# Patient Record
Sex: Male | Born: 1950 | Race: White | Hispanic: No | Marital: Married | Smoking: Never smoker
Health system: Southern US, Community
[De-identification: ages and names within clinical notes are randomized; demographics above are authoritative.]

## PROBLEM LIST (undated history)

## (undated) DIAGNOSIS — I4891 Unspecified atrial fibrillation: Secondary | ICD-10-CM

## (undated) HISTORY — PX: HERNIA REPAIR: SHX51

---

## 2005-07-29 ENCOUNTER — Ambulatory Visit: Payer: Self-pay | Admitting: Gastroenterology

## 2005-08-13 ENCOUNTER — Ambulatory Visit: Payer: Self-pay | Admitting: Gastroenterology

## 2006-08-18 ENCOUNTER — Ambulatory Visit: Payer: Self-pay | Admitting: Gastroenterology

## 2007-05-17 ENCOUNTER — Ambulatory Visit: Payer: Self-pay | Admitting: Specialist

## 2014-09-08 ENCOUNTER — Ambulatory Visit: Payer: Self-pay | Admitting: Endocrinology

## 2015-08-28 ENCOUNTER — Other Ambulatory Visit (HOSPITAL_BASED_OUTPATIENT_CLINIC_OR_DEPARTMENT_OTHER): Payer: Self-pay | Admitting: Orthopaedic Surgery

## 2015-08-29 ENCOUNTER — Encounter (HOSPITAL_BASED_OUTPATIENT_CLINIC_OR_DEPARTMENT_OTHER): Payer: Self-pay | Admitting: *Deleted

## 2015-09-03 ENCOUNTER — Other Ambulatory Visit (HOSPITAL_BASED_OUTPATIENT_CLINIC_OR_DEPARTMENT_OTHER): Payer: Self-pay | Admitting: Orthopaedic Surgery

## 2015-09-05 ENCOUNTER — Ambulatory Visit (HOSPITAL_BASED_OUTPATIENT_CLINIC_OR_DEPARTMENT_OTHER)
Admission: RE | Admit: 2015-09-05 | Discharge: 2015-09-05 | Disposition: A | Discharge status: Home / Self Care | Payer: Medicare Other | Source: Ambulatory Visit | Attending: Orthopaedic Surgery | Admitting: Orthopaedic Surgery

## 2015-09-05 ENCOUNTER — Encounter (HOSPITAL_BASED_OUTPATIENT_CLINIC_OR_DEPARTMENT_OTHER): Payer: Self-pay | Admitting: Certified Registered"

## 2015-09-05 ENCOUNTER — Ambulatory Visit (HOSPITAL_BASED_OUTPATIENT_CLINIC_OR_DEPARTMENT_OTHER): Payer: Medicare Other | Admitting: Certified Registered"

## 2015-09-05 ENCOUNTER — Encounter (HOSPITAL_BASED_OUTPATIENT_CLINIC_OR_DEPARTMENT_OTHER)
Admission: RE | Disposition: A | Discharge status: Home / Self Care | Payer: Self-pay | Source: Ambulatory Visit | Attending: Orthopaedic Surgery

## 2015-09-05 DIAGNOSIS — Z79899 Other long term (current) drug therapy: Secondary | ICD-10-CM | POA: Insufficient documentation

## 2015-09-05 DIAGNOSIS — M659 Synovitis and tenosynovitis, unspecified: Secondary | ICD-10-CM | POA: Diagnosis not present

## 2015-09-05 DIAGNOSIS — S83242A Other tear of medial meniscus, current injury, left knee, initial encounter: Secondary | ICD-10-CM | POA: Diagnosis present

## 2015-09-05 DIAGNOSIS — I4891 Unspecified atrial fibrillation: Secondary | ICD-10-CM | POA: Diagnosis not present

## 2015-09-05 DIAGNOSIS — Z7982 Long term (current) use of aspirin: Secondary | ICD-10-CM | POA: Insufficient documentation

## 2015-09-05 DIAGNOSIS — X58XXXA Exposure to other specified factors, initial encounter: Secondary | ICD-10-CM | POA: Diagnosis not present

## 2015-09-05 HISTORY — DX: Unspecified atrial fibrillation: I48.91

## 2015-09-05 HISTORY — PX: KNEE ARTHROSCOPY WITH MEDIAL MENISECTOMY: SHX5651

## 2015-09-05 SURGERY — ARTHROSCOPY, KNEE, WITH MEDIAL MENISCECTOMY
Anesthesia: General | Site: Knee | Laterality: Left

## 2015-09-05 MED ORDER — ATROPINE SULFATE 0.4 MG/ML IJ SOLN
INTRAMUSCULAR | Status: AC
  Filled 2015-09-05: qty 1

## 2015-09-05 MED ORDER — FENTANYL CITRATE (PF) 100 MCG/2ML IJ SOLN
INTRAMUSCULAR | Status: AC
  Filled 2015-09-05: qty 2

## 2015-09-05 MED ORDER — ONDANSETRON HCL 4 MG PO TABS: 4.0000 mg | ORAL_TABLET | Freq: Three times a day (TID) | ORAL | Status: DC | PRN

## 2015-09-05 MED ORDER — FENTANYL CITRATE (PF) 100 MCG/2ML IJ SOLN
50.0000 ug | INTRAMUSCULAR | Status: DC | PRN
  Administered 2015-09-05: 100 ug via INTRAVENOUS

## 2015-09-05 MED ORDER — CEFAZOLIN SODIUM-DEXTROSE 2-3 GM-% IV SOLR
2.0000 g | INTRAVENOUS | Status: AC
  Administered 2015-09-05: 2 g via INTRAVENOUS

## 2015-09-05 MED ORDER — CEFAZOLIN SODIUM-DEXTROSE 2-3 GM-% IV SOLR
INTRAVENOUS | Status: AC
  Filled 2015-09-05: qty 50

## 2015-09-05 MED ORDER — BUPIVACAINE HCL (PF) 0.25 % IJ SOLN
INTRAMUSCULAR | Status: AC
  Filled 2015-09-05: qty 30

## 2015-09-05 MED ORDER — MIDAZOLAM HCL 2 MG/2ML IJ SOLN
1.0000 mg | INTRAMUSCULAR | Status: DC | PRN
  Administered 2015-09-05: 2 mg via INTRAVENOUS

## 2015-09-05 MED ORDER — OXYCODONE HCL 5 MG PO TABS: 5.0000 mg | ORAL_TABLET | Freq: Once | ORAL | Status: DC | PRN

## 2015-09-05 MED ORDER — SCOPOLAMINE 1 MG/3DAYS TD PT72: 1.0000 | MEDICATED_PATCH | Freq: Once | TRANSDERMAL | Status: DC | PRN

## 2015-09-05 MED ORDER — DEXTROSE 5 % IV SOLN
10.0000 mg | INTRAVENOUS | Status: DC | PRN
  Administered 2015-09-05: 50 ug/min via INTRAVENOUS

## 2015-09-05 MED ORDER — PHENYLEPHRINE HCL 10 MG/ML IJ SOLN
INTRAMUSCULAR | Status: DC | PRN
  Administered 2015-09-05 (×4): 40 ug via INTRAVENOUS

## 2015-09-05 MED ORDER — PROPOFOL 500 MG/50ML IV EMUL
INTRAVENOUS | Status: AC
  Filled 2015-09-05: qty 50

## 2015-09-05 MED ORDER — SENNOSIDES-DOCUSATE SODIUM 8.6-50 MG PO TABS
1.0000 | ORAL_TABLET | Freq: Every evening | ORAL | Status: DC | PRN
Start: 2015-09-05 — End: 2020-04-01

## 2015-09-05 MED ORDER — ONDANSETRON HCL 4 MG/2ML IJ SOLN
INTRAMUSCULAR | Status: AC
  Filled 2015-09-05: qty 2

## 2015-09-05 MED ORDER — LIDOCAINE HCL (CARDIAC) 20 MG/ML IV SOLN
INTRAVENOUS | Status: AC
  Filled 2015-09-05: qty 5

## 2015-09-05 MED ORDER — BUPIVACAINE HCL (PF) 0.25 % IJ SOLN
INTRAMUSCULAR | Status: DC | PRN
  Administered 2015-09-05: 20 mL

## 2015-09-05 MED ORDER — OXYCODONE HCL 5 MG/5ML PO SOLN: 5.0000 mg | Freq: Once | ORAL | Status: DC | PRN

## 2015-09-05 MED ORDER — ONDANSETRON HCL 4 MG/2ML IJ SOLN
INTRAMUSCULAR | Status: DC | PRN
  Administered 2015-09-05: 4 mg via INTRAVENOUS

## 2015-09-05 MED ORDER — PROPOFOL 10 MG/ML IV BOLUS
INTRAVENOUS | Status: DC | PRN
Start: 2015-09-05 — End: 2015-09-05
  Administered 2015-09-05: 200 mg via INTRAVENOUS

## 2015-09-05 MED ORDER — PHENYLEPHRINE 40 MCG/ML (10ML) SYRINGE FOR IV PUSH (FOR BLOOD PRESSURE SUPPORT)
PREFILLED_SYRINGE | INTRAVENOUS | Status: AC
  Filled 2015-09-05: qty 10

## 2015-09-05 MED ORDER — DEXAMETHASONE SODIUM PHOSPHATE 4 MG/ML IJ SOLN
INTRAMUSCULAR | Status: DC | PRN
  Administered 2015-09-05: 10 mg via INTRAVENOUS

## 2015-09-05 MED ORDER — SUCCINYLCHOLINE CHLORIDE 20 MG/ML IJ SOLN
INTRAMUSCULAR | Status: AC
  Filled 2015-09-05: qty 1

## 2015-09-05 MED ORDER — MEPERIDINE HCL 25 MG/ML IJ SOLN: 6.2500 mg | INTRAMUSCULAR | Status: DC | PRN

## 2015-09-05 MED ORDER — DEXAMETHASONE SODIUM PHOSPHATE 10 MG/ML IJ SOLN
INTRAMUSCULAR | Status: AC
  Filled 2015-09-05: qty 1

## 2015-09-05 MED ORDER — GLYCOPYRROLATE 0.2 MG/ML IJ SOLN: 0.2000 mg | Freq: Once | INTRAMUSCULAR | Status: DC | PRN

## 2015-09-05 MED ORDER — SODIUM CHLORIDE 0.9 % IR SOLN
Status: DC | PRN
  Administered 2015-09-05: 3000 mL

## 2015-09-05 MED ORDER — HYDROCODONE-ACETAMINOPHEN 7.5-325 MG PO TABS: 1.0000 | ORAL_TABLET | Freq: Four times a day (QID) | ORAL | Status: DC | PRN

## 2015-09-05 MED ORDER — HYDROMORPHONE HCL 1 MG/ML IJ SOLN: 0.2500 mg | INTRAMUSCULAR | Status: DC | PRN

## 2015-09-05 MED ORDER — LACTATED RINGERS IV SOLN
INTRAVENOUS | Status: DC
  Administered 2015-09-05 (×2): via INTRAVENOUS

## 2015-09-05 MED ORDER — MIDAZOLAM HCL 2 MG/2ML IJ SOLN
INTRAMUSCULAR | Status: AC
  Filled 2015-09-05: qty 2

## 2015-09-05 MED ORDER — PHENYLEPHRINE HCL 10 MG/ML IJ SOLN
INTRAMUSCULAR | Status: AC
  Filled 2015-09-05: qty 1

## 2015-09-05 MED ORDER — LIDOCAINE HCL (CARDIAC) 20 MG/ML IV SOLN
INTRAVENOUS | Status: DC | PRN
  Administered 2015-09-05: 80 mg via INTRAVENOUS

## 2015-09-05 SURGICAL SUPPLY — 40 items
BANDAGE ACE 6X5 VEL STRL LF (GAUZE/BANDAGES/DRESSINGS) ×6 IMPLANT
BANDAGE ESMARK 6X9 LF (GAUZE/BANDAGES/DRESSINGS) IMPLANT
BLADE 4.2CUDA (BLADE) ×3 IMPLANT
BLADE CUDA GRT WHITE 3.5 (BLADE) ×3 IMPLANT
BLADE CUDA SHAVER 3.5 (BLADE) IMPLANT
BLADE CUTTER GATOR 3.5 (BLADE) IMPLANT
BNDG ESMARK 6X9 LF (GAUZE/BANDAGES/DRESSINGS)
CUFF TOURNIQUET SINGLE 34IN LL (TOURNIQUET CUFF) ×3 IMPLANT
DRAPE ARTHROSCOPY W/POUCH 90 (DRAPES) ×3 IMPLANT
DRAPE SURG 17X23 STRL (DRAPES) ×6 IMPLANT
DRAPE U 20/CS (DRAPES) IMPLANT
DURAPREP 26ML APPLICATOR (WOUND CARE) ×3 IMPLANT
ELECT MENISCUS 165MM 90D (ELECTRODE) IMPLANT
ELECT REM PT RETURN 9FT ADLT (ELECTROSURGICAL)
ELECTRODE REM PT RTRN 9FT ADLT (ELECTROSURGICAL) IMPLANT
GAUZE SPONGE 4X4 12PLY STRL (GAUZE/BANDAGES/DRESSINGS) ×3 IMPLANT
GAUZE XEROFORM 1X8 LF (GAUZE/BANDAGES/DRESSINGS) ×3 IMPLANT
GLOVE BIO SURGEON STRL SZ7 (GLOVE) ×3 IMPLANT
GLOVE BIOGEL PI IND STRL 7.0 (GLOVE) ×2 IMPLANT
GLOVE BIOGEL PI INDICATOR 7.0 (GLOVE) ×4
GLOVE ECLIPSE 6.5 STRL STRAW (GLOVE) ×3 IMPLANT
GLOVE SKINSENSE NS SZ7.5 (GLOVE) ×2
GLOVE SKINSENSE STRL SZ7.5 (GLOVE) ×1 IMPLANT
GLOVE SURG SYN 7.5  E (GLOVE) ×2
GLOVE SURG SYN 7.5 E (GLOVE) ×1 IMPLANT
GOWN STRL REIN XL XLG (GOWN DISPOSABLE) ×3 IMPLANT
GOWN STRL REUS W/ TWL LRG LVL3 (GOWN DISPOSABLE) ×1 IMPLANT
GOWN STRL REUS W/TWL LRG LVL3 (GOWN DISPOSABLE) ×2
KNEE WRAP E Z 3 GEL PACK (MISCELLANEOUS) ×3 IMPLANT
MANIFOLD NEPTUNE II (INSTRUMENTS) ×3 IMPLANT
PACK ARTHROSCOPY DSU (CUSTOM PROCEDURE TRAY) ×3 IMPLANT
PACK BASIN DAY SURGERY FS (CUSTOM PROCEDURE TRAY) ×3 IMPLANT
PENCIL BUTTON HOLSTER BLD 10FT (ELECTRODE) IMPLANT
RESECTOR FULL RADIUS 4.2MM (BLADE) IMPLANT
SET ARTHROSCOPY TUBING (MISCELLANEOUS) ×2
SET ARTHROSCOPY TUBING LN (MISCELLANEOUS) ×1 IMPLANT
SUT ETHILON 3 0 PS 1 (SUTURE) ×3 IMPLANT
TOWEL OR 17X24 6PK STRL BLUE (TOWEL DISPOSABLE) ×3 IMPLANT
TOWEL OR NON WOVEN STRL DISP B (DISPOSABLE) IMPLANT
WATER STERILE IRR 1000ML POUR (IV SOLUTION) ×3 IMPLANT

## 2015-09-05 NOTE — Op Note (Signed)
   Surgery Date: 09/05/2015  Surgeon(s): Arius Harnois Donnelly Stager, MD  ANESTHESIA:  general  FLUIDS: Per anesthesia record.   ESTIMATED BLOOD LOSS: minimal  PREOPERATIVE DIAGNOSES:  1. Left knee medial meniscus tear 2.  knee synovitis  POSTOPERATIVE DIAGNOSES:  same  PROCEDURES PERFORMED:  1. Left knee arthroscopy with extensive synovectomy 2. knee arthroscopy with arthroscopic partial medial meniscectomy 3. knee arthroscopy with arthroscopic chondroplasty medial femoral condyle and medial tibial plateau.   DESCRIPTION OF PROCEDURE: Mr. Sortor is a 65 y.o.-year-old male with left knee medial meniscus tear. Plans are to proceed with partial medial meniscectomy and diagnostic arthroscopy with debridement as indicated. Full discussion held regarding risks benefits alternatives and complications related surgical intervention. Conservative care options reviewed. All questions answered.  The patient was identified in the preoperative holding area and the operative extremity was marked. The patient was brought to the operating room and transferred to operating table in a supine position. Satisfactory general anesthesia was induced by anesthesiology.    Standard anterolateral, anteromedial arthroscopy portals were obtained. The anteromedial portal was obtained with a spinal needle for localization under direct visualization with subsequent diagnostic findings.   Anteromedial and anterolateral chambers: mild synovitis. The synovitis was debrided with a 4.5 mm full radius shaver through both the anteromedial and lateral portals.   Suprapatellar pouch and gutters: moderate synovitis or debris. Patella chondral surface: Grade 3 Trochlear chondral surface: Grade 3 Patellofemoral tracking: normal Medial meniscus: degenerative horizontal cleavage tear of posterior horn.  Medial femoral condyle flexion bearing surface: Grade 2 Medial femoral condyle extension bearing surface: Grade 2 Medial tibial plateau:  Grade 2 Anterior cruciate ligament:stable Posterior cruciate ligament:stable Lateral meniscus: normal.   Lateral femoral condyle flexion bearing surface: Grade 0 Lateral femoral condyle extension bearing surface: Grade 0 Lateral tibial plateau: Grade 0  After completion of synovectomy, diagnostic exam, and debridements as described, all compartments were checked and no residual debris remained. Hemostasis was achieved with the cautery wand. The portals were approximated with interrupted nylon suture. All excess fluid was expressed from the joint. The portals were approximated with interrupted nylon suture. Xeroform sterile gauze dressings were applied followed by Ace bandage and ice pack.   DISPOSITION: The patient was awakened from general anesthetic, extubated, taken to the recovery room in medically stable condition, no apparent complications. The patient may be weightbearing as tolerated to the operative lower extremity.  Range of motion of right knee as tolerated.  Mayra Reel, MD [facility] [phone] 9:26 AM

## 2015-09-05 NOTE — Anesthesia Procedure Notes (Signed)
Procedure Name: LMA Insertion Date/Time: 09/05/2015 8:41 AM Performed by: Curly [place] Pre-anesthesia Checklist: Patient identified, Emergency Drugs available, Suction available and Patient being monitored Patient Re-evaluated:Patient Re-evaluated prior to inductionOxygen Delivery Method: Circle System Utilized Preoxygenation: Pre-oxygenation with 100% oxygen Intubation Type: IV induction Ventilation: Mask ventilation without difficulty LMA: LMA inserted LMA Size: 5.0 Number of attempts: 1 Airway Equipment and Method: Bite block Placement Confirmation: positive ETCO2 and breath sounds checked- equal and bilateral Tube secured with: Tape Dental Injury: Teeth and Oropharynx as per pre-operative assessment

## 2015-09-05 NOTE — Anesthesia Preprocedure Evaluation (Signed)
Anesthesia Evaluation  Patient identified by MRN, date of birth, ID band Patient awake    Reviewed: Allergy & Precautions, NPO status , Patient's Chart, lab work & pertinent test results, reviewed documented beta blocker date and time   Airway Mallampati: II  TM Distance: >3 FB Neck ROM: Full    Dental  (+) Teeth Intact, Dental Advisory Given   Pulmonary    breath sounds clear to auscultation       Cardiovascular  Rhythm:Regular Rate:Normal     Neuro/Psych    GI/Hepatic   Endo/Other    Renal/GU      Musculoskeletal   Abdominal   Peds  Hematology   Anesthesia Other Findings Hx of A Fib that doesn't affect his activities.  Reproductive/Obstetrics                             Anesthesia Physical Anesthesia Plan  ASA: II  Anesthesia Plan: General   Post-op Pain Management:    Induction: Intravenous  Airway Management Planned: LMA  Additional Equipment:   Intra-op Plan:   Post-operative Plan: Extubation in OR  Informed Consent: I have reviewed the patients History and Physical, chart, labs and discussed the procedure including the risks, benefits and alternatives for the proposed anesthesia with the patient or authorized representative who has indicated his/her understanding and acceptance.   Dental advisory given  Plan Discussed with: CRNA, Anesthesiologist and Surgeon  Anesthesia Plan Comments:         Anesthesia Quick Evaluation

## 2015-09-05 NOTE — Transfer of Care (Signed)
Immediate Anesthesia Transfer of Care Note  Patient: Johnny Shepard  Procedure(s) Performed: Procedure(s): LEFT KNEE ARTHROSCOPY WITH PARTIAL MEDIAL MENISCECTOMY and synovectomy (Left)  Patient Location: PACU  Anesthesia Type:General  Level of Consciousness: awake, alert  and patient cooperative  Airway & Oxygen Therapy: Patient Spontanous Breathing and Patient connected to face mask oxygen  Post-op Assessment: Report given to RN, Post -op Vital signs reviewed and stable and Patient moving all extremities  Post vital signs: Reviewed and stable  Last Vitals:  Filed Vitals:   09/05/15 0732  BP: 144/108  Pulse: 54  Temp: 36.4 C  Resp: 20    Complications: No apparent anesthesia complications

## 2015-09-05 NOTE — H&P (Signed)
    PREOPERATIVE H&P  Chief Complaint: Left knee medial meniscal tear  HPI: Johnny Shepard is a 65 y.o. male who presents for surgical treatment of Left knee medial meniscal tear.  He denies any changes in medical history.  Past Medical History  Diagnosis Date  . A-fib [facility])    Past Surgical History  Procedure Laterality Date  . Hernia repair     Social History   Social History  . Marital Status: Married    Spouse Name: N/A  . Number of Children: N/A  . Years of Education: N/A   Social History Main Topics  . Smoking status: Never Smoker   . Smokeless tobacco: Never Used  . Alcohol Use: No  . Drug Use: No  . Sexual Activity: Not Asked   Other Topics Concern  . None   Social History Narrative   History reviewed. No pertinent family history. Allergies  Allergen Reactions  . Bee Venom Shortness Of Breath  . Iodine Shortness Of Breath   Prior to Admission medications   Medication Sig Start Date End Date Taking? Authorizing Provider  aspirin EC 325 MG tablet Take 325 mg by mouth daily.   Yes Historical Provider, MD  Multiple Vitamins-Minerals (MENS 50+ ADVANCED) CAPS Take by mouth.   Yes Historical Provider, MD  sotalol (BETAPACE) 80 MG tablet Take 80 mg by mouth 2 (two) times daily.   Yes Historical Provider, MD     Positive ROS: All other systems have been reviewed and were otherwise negative with the exception of those mentioned in the HPI and as above.  Physical Exam: General: Alert, no acute distress Cardiovascular: No pedal edema Respiratory: No cyanosis, no use of accessory musculature GI: abdomen soft Skin: No lesions in the area of chief complaint Neurologic: Sensation intact distally Psychiatric: Patient is competent for consent with normal mood and affect Lymphatic: no lymphedema  MUSCULOSKELETAL: exam stable  Assessment: Left knee medial meniscal tear  Plan: Plan for Procedure(s): LEFT KNEE ARTHROSCOPY WITH PARTIAL MEDIAL MENISCECTOMY  The  risks benefits and alternatives were discussed with the patient including but not limited to the risks of nonoperative treatment, versus surgical intervention including infection, bleeding, nerve injury,  blood clots, cardiopulmonary complications, morbidity, mortality, among others, and they were willing to proceed.   Cheral Almas, MD   09/05/2015 8:28 AM

## 2015-09-05 NOTE — Discharge Instructions (Signed)
Post-operative patient instructions  °Knee Arthroscopy  ° °• Ice:  Place intermittent ice or cooler pack over your knee, 30 minutes on and 30 minutes off.  Continue this for the first 72 hours after surgery, then save ice for use after therapy sessions or on more active days.   °• Weight:  You may bear weight on your leg as your symptoms allow. °• Crutches:  Use crutches (or walker) to assist in walking until told to discontinue by your physical therapist or physician. This will help to reduce pain. °• Strengthening:  Perform simple thigh squeezes (isometric quad contractions) and straight leg lifts as you are able (3 sets of 5 to 10 repetitions, 3 times a day).  For the leg lifts, have someone support under your ankle in the beginning until you have increased strength enough to do this on your own.  To help get started on thigh squeezes, place a pillow under your knee and push down on the pillow with back of knee (sometimes easier to do than with your leg fully straight). °• Motion:  Perform gentle knee motion as tolerated - this is gentle bending and straightening of the knee. Seated heel slides: you can start by sitting in a chair, remove your brace, and gently slide your heel back on the floor - allowing your knee to bend. Have someone help you straighten your knee (or use your other leg/foot hooked under your ankle.  °• Dressing:  Perform 1st dressing change at 2 days postoperative. A moderate amount of blood tinged drainage is to be expected.  So if you bleed through the dressing on the first or second day or if you have fevers, it is fine to change the dressing/check the wounds early and redress wound. Elevate your leg.  If it bleeds through again, or if the incisions are leaking frank blood, please call the office. May change dressing every 1-2 days thereafter to help watch wounds. Can purchase Tegderm (or 3M Nexcare) water resistant dressings at local pharmacy / [employer]. °• Shower:  Light shower is ok after  2 days.  Please take shower, NO bath. Recover with gauze and ace wrap to help keep wounds protected.   °• Pain medication:  A narcotic pain medication has been prescribed.  Take as directed.  Typically you need narcotic pain medication more regularly during the first 3 to 5 days after surgery.  Decrease your use of the medication as the pain improves.  Narcotics can sometimes cause constipation, even after a few doses.  If you have problems with constipation, you can take an over the counter stool softener or light laxative.  If you have persistent problems, please notify your physician’s office. °• Physical therapy: Additional activity guidelines to be provided by your physician or physical therapist at follow-up visits.  °• Driving: Do not recommend driving x 2 weeks post surgical, especially if surgery performed on right side. Should not drive while taking narcotic pain medications. It typically takes at least 2 weeks to restore sufficient neuromuscular function for normal reaction times for driving safety.  °• Call [phone] for questions or problems. Evenings you will be forwarded to the hospital operator.  Ask for the orthopaedic physician on call. Please call if you experience:  °  °o Redness, foul smelling, or persistent drainage from the surgical site  °o worsening knee pain and swelling not responsive to medication  °o any calf pain and or swelling of the lower leg  °o temperatures greater than 100.5 F °  o other questions or concerns ° ° °Thank you for allowing us to be a part of your care. ° °Post Anesthesia Home Care Instructions ° °Activity: °Get plenty of rest for the remainder of the day. A responsible adult should stay with you for 24 hours following the procedure.  °For the next 24 hours, DO NOT: °-Drive a car °-Operate machinery °-Drink alcoholic beverages °-Take any medication unless instructed by your physician °-Make any legal decisions or sign important papers. ° °Meals: °Start with liquid  foods such as gelatin or soup. Progress to regular foods as tolerated. Avoid greasy, spicy, heavy foods. If nausea and/or vomiting occur, drink only clear liquids until the nausea and/or vomiting subsides. Call your physician if vomiting continues. ° °Special Instructions/Symptoms: °Your throat may feel dry or sore from the anesthesia or the breathing tube placed in your throat during surgery. If this causes discomfort, gargle with warm salt water. The discomfort should disappear within 24 hours. ° °If you had a scopolamine patch placed behind your ear for the management of post- operative nausea and/or vomiting: ° °1. The medication in the patch is effective for 72 hours, after which it should be removed.  Wrap patch in a tissue and discard in the trash. Wash hands thoroughly with soap and water. °2. You may remove the patch earlier than 72 hours if you experience unpleasant side effects which may include dry mouth, dizziness or visual disturbances. °3. Avoid touching the patch. Wash your hands with soap and water after contact with the patch. °  ° °

## 2015-09-05 NOTE — Anesthesia Postprocedure Evaluation (Signed)
Anesthesia Post Note  Patient: Johnny Shepard  Procedure(s) Performed: Procedure(s) (LRB): LEFT KNEE ARTHROSCOPY WITH PARTIAL MEDIAL MENISCECTOMY and synovectomy (Left)  Patient location during evaluation: PACU Anesthesia Type: General Level of consciousness: awake and alert Pain management: pain level controlled Vital Signs Assessment: post-procedure vital signs reviewed and stable Respiratory status: spontaneous breathing, nonlabored ventilation and respiratory function stable Cardiovascular status: blood pressure returned to baseline and stable Postop Assessment: no signs of nausea or vomiting Anesthetic complications: no    Last Vitals:  Filed Vitals:   09/05/15 1000 09/05/15 1015  BP: 118/81 121/86  Pulse: 69 59  Temp:    Resp: 14 19    Last Pain:  Filed Vitals:   09/05/15 1023  PainSc: 0-No pain                 Dymon Summerhill A

## 2015-09-06 ENCOUNTER — Encounter (HOSPITAL_BASED_OUTPATIENT_CLINIC_OR_DEPARTMENT_OTHER): Payer: Self-pay | Admitting: Orthopaedic Surgery

## 2020-04-01 ENCOUNTER — Other Ambulatory Visit: Payer: Self-pay

## 2020-04-01 ENCOUNTER — Emergency Department: Payer: Medicare Other

## 2020-04-01 ENCOUNTER — Inpatient Hospital Stay
Admission: EM | Admit: 2020-04-01 | Discharge: 2020-05-11 | DRG: 208 | Disposition: E | Payer: Medicare Other | Attending: Internal Medicine | Admitting: Internal Medicine

## 2020-04-01 DIAGNOSIS — E785 Hyperlipidemia, unspecified: Secondary | ICD-10-CM | POA: Diagnosis present

## 2020-04-01 DIAGNOSIS — R0902 Hypoxemia: Secondary | ICD-10-CM

## 2020-04-01 DIAGNOSIS — J9601 Acute respiratory failure with hypoxia: Secondary | ICD-10-CM | POA: Diagnosis present

## 2020-04-01 DIAGNOSIS — Z7982 Long term (current) use of aspirin: Secondary | ICD-10-CM | POA: Diagnosis not present

## 2020-04-01 DIAGNOSIS — Z283 Underimmunization status: Secondary | ICD-10-CM | POA: Diagnosis not present

## 2020-04-01 DIAGNOSIS — E878 Other disorders of electrolyte and fluid balance, not elsewhere classified: Secondary | ICD-10-CM | POA: Diagnosis present

## 2020-04-01 DIAGNOSIS — E871 Hypo-osmolality and hyponatremia: Secondary | ICD-10-CM | POA: Diagnosis present

## 2020-04-01 DIAGNOSIS — R531 Weakness: Secondary | ICD-10-CM | POA: Diagnosis not present

## 2020-04-01 DIAGNOSIS — N179 Acute kidney failure, unspecified: Secondary | ICD-10-CM | POA: Diagnosis not present

## 2020-04-01 DIAGNOSIS — Z789 Other specified health status: Secondary | ICD-10-CM

## 2020-04-01 DIAGNOSIS — Z66 Do not resuscitate: Secondary | ICD-10-CM | POA: Diagnosis not present

## 2020-04-01 DIAGNOSIS — R739 Hyperglycemia, unspecified: Secondary | ICD-10-CM | POA: Diagnosis not present

## 2020-04-01 DIAGNOSIS — D6869 Other thrombophilia: Secondary | ICD-10-CM | POA: Diagnosis not present

## 2020-04-01 DIAGNOSIS — I959 Hypotension, unspecified: Secondary | ICD-10-CM | POA: Diagnosis not present

## 2020-04-01 DIAGNOSIS — D6859 Other primary thrombophilia: Secondary | ICD-10-CM | POA: Diagnosis not present

## 2020-04-01 DIAGNOSIS — I4891 Unspecified atrial fibrillation: Secondary | ICD-10-CM | POA: Diagnosis not present

## 2020-04-01 DIAGNOSIS — Z978 Presence of other specified devices: Secondary | ICD-10-CM

## 2020-04-01 DIAGNOSIS — J80 Acute respiratory distress syndrome: Secondary | ICD-10-CM | POA: Diagnosis not present

## 2020-04-01 DIAGNOSIS — I482 Chronic atrial fibrillation, unspecified: Secondary | ICD-10-CM | POA: Diagnosis present

## 2020-04-01 DIAGNOSIS — J1282 Pneumonia due to coronavirus disease 2019: Secondary | ICD-10-CM | POA: Diagnosis present

## 2020-04-01 DIAGNOSIS — D696 Thrombocytopenia, unspecified: Secondary | ICD-10-CM | POA: Diagnosis not present

## 2020-04-01 DIAGNOSIS — G931 Anoxic brain damage, not elsewhere classified: Secondary | ICD-10-CM | POA: Diagnosis not present

## 2020-04-01 DIAGNOSIS — Z4682 Encounter for fitting and adjustment of non-vascular catheter: Secondary | ICD-10-CM

## 2020-04-01 DIAGNOSIS — U071 COVID-19: Secondary | ICD-10-CM | POA: Diagnosis present

## 2020-04-01 DIAGNOSIS — Z7901 Long term (current) use of anticoagulants: Secondary | ICD-10-CM | POA: Diagnosis not present

## 2020-04-01 DIAGNOSIS — R7401 Elevation of levels of liver transaminase levels: Secondary | ICD-10-CM | POA: Diagnosis present

## 2020-04-01 DIAGNOSIS — J982 Interstitial emphysema: Secondary | ICD-10-CM

## 2020-04-01 DIAGNOSIS — R0602 Shortness of breath: Secondary | ICD-10-CM | POA: Diagnosis present

## 2020-04-01 DIAGNOSIS — Z95818 Presence of other cardiac implants and grafts: Secondary | ICD-10-CM

## 2020-04-01 DIAGNOSIS — Z888 Allergy status to other drugs, medicaments and biological substances status: Secondary | ICD-10-CM

## 2020-04-01 DIAGNOSIS — Z4659 Encounter for fitting and adjustment of other gastrointestinal appliance and device: Secondary | ICD-10-CM

## 2020-04-01 DIAGNOSIS — M79604 Pain in right leg: Secondary | ICD-10-CM

## 2020-04-01 DIAGNOSIS — M79662 Pain in left lower leg: Secondary | ICD-10-CM

## 2020-04-01 DIAGNOSIS — J9600 Acute respiratory failure, unspecified whether with hypoxia or hypercapnia: Secondary | ICD-10-CM

## 2020-04-01 DIAGNOSIS — Z91030 Bee allergy status: Secondary | ICD-10-CM

## 2020-04-01 DIAGNOSIS — J9311 Primary spontaneous pneumothorax: Secondary | ICD-10-CM | POA: Diagnosis not present

## 2020-04-01 DIAGNOSIS — J9383 Other pneumothorax: Secondary | ICD-10-CM | POA: Diagnosis not present

## 2020-04-01 DIAGNOSIS — J939 Pneumothorax, unspecified: Secondary | ICD-10-CM

## 2020-04-01 DIAGNOSIS — Z79899 Other long term (current) drug therapy: Secondary | ICD-10-CM

## 2020-04-01 DIAGNOSIS — I82409 Acute embolism and thrombosis of unspecified deep veins of unspecified lower extremity: Secondary | ICD-10-CM

## 2020-04-01 LAB — LACTATE DEHYDROGENASE: LDH: 390 U/L — ABNORMAL HIGH (ref 98–192)

## 2020-04-01 LAB — FIBRIN DERIVATIVES D-DIMER (ARMC ONLY)
Fibrin derivatives D-dimer (ARMC): 935.68 ng/mL (FEU) — ABNORMAL HIGH (ref 0.00–499.00)
Fibrin derivatives D-dimer (ARMC): 914.35 ng/mL (FEU) — ABNORMAL HIGH (ref 0.00–499.00)

## 2020-04-01 LAB — FERRITIN: Ferritin: 1605 ng/mL — ABNORMAL HIGH (ref 24–336)

## 2020-04-01 LAB — CBC WITH DIFFERENTIAL/PLATELET
Abs Immature Granulocytes: 0.04 10*3/uL (ref 0.00–0.07)
Basophils Absolute: 0 10*3/uL (ref 0.0–0.1)
Basophils Relative: 0 %
Eosinophils Absolute: 0 10*3/uL (ref 0.0–0.5)
Eosinophils Relative: 0 %
HCT: 41.5 % (ref 39.0–52.0)
Hemoglobin: 14.1 g/dL (ref 13.0–17.0)
Immature Granulocytes: 1 %
Lymphocytes Relative: 12 %
Lymphs Abs: 0.7 10*3/uL (ref 0.7–4.0)
MCH: 29.7 pg (ref 26.0–34.0)
MCHC: 34 g/dL (ref 30.0–36.0)
MCV: 87.6 fL (ref 80.0–100.0)
Monocytes Absolute: 0.4 10*3/uL (ref 0.1–1.0)
Monocytes Relative: 6 %
Neutro Abs: 5 10*3/uL (ref 1.7–7.7)
Neutrophils Relative %: 81 %
Platelets: 161 10*3/uL (ref 150–400)
RBC: 4.74 MIL/uL (ref 4.22–5.81)
RDW: 13.6 % (ref 11.5–15.5)
Smear Review: NORMAL
WBC: 6 10*3/uL (ref 4.0–10.5)
nRBC: 0 % (ref 0.0–0.2)

## 2020-04-01 LAB — COMPREHENSIVE METABOLIC PANEL
ALT: 33 U/L (ref 0–44)
AST: 56 U/L — ABNORMAL HIGH (ref 15–41)
Albumin: 2.8 g/dL — ABNORMAL LOW (ref 3.5–5.0)
Alkaline Phosphatase: 30 U/L — ABNORMAL LOW (ref 38–126)
Anion gap: 13 (ref 5–15)
BUN: 28 mg/dL — ABNORMAL HIGH (ref 8–23)
CO2: 23 mmol/L (ref 22–32)
Calcium: 8.1 mg/dL — ABNORMAL LOW (ref 8.9–10.3)
Chloride: 97 mmol/L — ABNORMAL LOW (ref 98–111)
Creatinine, Ser: 0.88 mg/dL (ref 0.61–1.24)
GFR calc Af Amer: >60 mL/min (ref >60)
GFR calc non Af Amer: >60 mL/min (ref >60)
Glucose, Bld: 131 mg/dL — ABNORMAL HIGH (ref 70–99)
Potassium: 3.9 mmol/L (ref 3.5–5.1)
Sodium: 133 mmol/L — ABNORMAL LOW (ref 135–145)
Total Bilirubin: 0.7 mg/dL (ref 0.3–1.2)
Total Protein: 6.7 g/dL (ref 6.5–8.1)

## 2020-04-01 LAB — PROCALCITONIN
Procalcitonin: <0.1 ng/mL
Procalcitonin: <0.1 ng/mL

## 2020-04-01 LAB — BRAIN NATRIURETIC PEPTIDE: B Natriuretic Peptide: 92 pg/mL (ref 0.0–100.0)

## 2020-04-01 LAB — SARS CORONAVIRUS 2 BY RT PCR (HOSPITAL ORDER, PERFORMED IN ~~LOC~~ HOSPITAL LAB): SARS Coronavirus 2: POSITIVE — AB

## 2020-04-01 LAB — TROPONIN I (HIGH SENSITIVITY)
Troponin I (High Sensitivity): 8 ng/L (ref <18)
Troponin I (High Sensitivity): 10 ng/L (ref <18)

## 2020-04-01 LAB — C-REACTIVE PROTEIN: CRP: 5.9 mg/dL — ABNORMAL HIGH (ref <1.0)

## 2020-04-01 MED ORDER — HYDROCOD POLST-CPM POLST ER 10-8 MG/5ML PO SUER
5.0000 mL | Freq: Two times a day (BID) | ORAL | Status: DC | PRN
  Administered 2020-04-11: 5 mL via ORAL
  Filled 2020-04-01: qty 5

## 2020-04-01 MED ORDER — ZINC SULFATE 220 (50 ZN) MG PO CAPS
220.0000 mg | ORAL_CAPSULE | Freq: Every day | ORAL | Status: DC
  Administered 2020-04-02 – 2020-04-13 (×11): 220 mg via ORAL
  Filled 2020-04-01 (×11): qty 1

## 2020-04-01 MED ORDER — ACETAMINOPHEN 325 MG PO TABS
650.0000 mg | ORAL_TABLET | Freq: Four times a day (QID) | ORAL | Status: DC | PRN
  Administered 2020-04-04 – 2020-04-13 (×7): 650 mg via ORAL
  Filled 2020-04-01 (×8): qty 2

## 2020-04-01 MED ORDER — DEXAMETHASONE SODIUM PHOSPHATE 10 MG/ML IJ SOLN
INTRAMUSCULAR | Status: AC
  Administered 2020-04-01: 10 mg via INTRAVENOUS
  Filled 2020-04-01: qty 1

## 2020-04-01 MED ORDER — ONDANSETRON HCL 4 MG/2ML IJ SOLN: 4.0000 mg | Freq: Four times a day (QID) | INTRAMUSCULAR | Status: DC | PRN

## 2020-04-01 MED ORDER — FAMOTIDINE 20 MG PO TABS
20.0000 mg | ORAL_TABLET | Freq: Two times a day (BID) | ORAL | Status: DC
  Administered 2020-04-01 – 2020-04-13 (×24): 20 mg via ORAL
  Filled 2020-04-01 (×24): qty 1

## 2020-04-01 MED ORDER — ENOXAPARIN SODIUM 40 MG/0.4ML ~~LOC~~ SOLN
40.0000 mg | SUBCUTANEOUS | Status: DC
  Filled 2020-04-01: qty 0.4

## 2020-04-01 MED ORDER — DEXAMETHASONE SODIUM PHOSPHATE 10 MG/ML IJ SOLN: 10.0000 mg | Freq: Once | INTRAMUSCULAR | Status: AC

## 2020-04-01 MED ORDER — VITAMIN D 25 MCG (1000 UNIT) PO TABS
1000.0000 [IU] | ORAL_TABLET | Freq: Every day | ORAL | Status: DC
  Administered 2020-04-02 – 2020-04-13 (×12): 1000 [IU] via ORAL
  Filled 2020-04-01 (×12): qty 1

## 2020-04-01 MED ORDER — SODIUM CHLORIDE 0.9 % IV SOLN
200.0000 mg | Freq: Once | INTRAVENOUS | Status: AC
  Administered 2020-04-01: 200 mg via INTRAVENOUS
  Filled 2020-04-01: qty 200

## 2020-04-01 MED ORDER — SODIUM CHLORIDE 0.9 % IV SOLN
100.0000 mg | Freq: Every day | INTRAVENOUS | Status: AC
  Administered 2020-04-02 – 2020-04-05 (×4): 100 mg via INTRAVENOUS
  Filled 2020-04-01: qty 100
  Filled 2020-04-01 (×2): qty 20
  Filled 2020-04-01 (×3): qty 100
  Filled 2020-04-01: qty 20

## 2020-04-01 MED ORDER — ASPIRIN EC 325 MG PO TBEC
325.0000 mg | DELAYED_RELEASE_TABLET | Freq: Every day | ORAL | Status: DC
  Administered 2020-04-02 – 2020-04-13 (×12): 325 mg via ORAL
  Filled 2020-04-01 (×13): qty 1

## 2020-04-01 MED ORDER — GUAIFENESIN-DM 100-10 MG/5ML PO SYRP
10.0000 mL | ORAL_SOLUTION | ORAL | Status: DC | PRN
  Administered 2020-04-03 – 2020-04-06 (×3): 10 mL via ORAL
  Filled 2020-04-01 (×4): qty 10

## 2020-04-01 MED ORDER — TRAZODONE HCL 50 MG PO TABS
25.0000 mg | ORAL_TABLET | Freq: Every evening | ORAL | Status: DC | PRN
  Administered 2020-04-09 – 2020-04-11 (×2): 25 mg via ORAL
  Filled 2020-04-01 (×2): qty 1

## 2020-04-01 MED ORDER — DEXAMETHASONE SODIUM PHOSPHATE 10 MG/ML IJ SOLN
6.0000 mg | INTRAMUSCULAR | Status: DC
  Filled 2020-04-01: qty 0.6

## 2020-04-01 MED ORDER — ASCORBIC ACID 500 MG PO TABS
500.0000 mg | ORAL_TABLET | Freq: Every day | ORAL | Status: DC
  Administered 2020-04-02 – 2020-04-13 (×12): 500 mg via ORAL
  Filled 2020-04-01 (×12): qty 1

## 2020-04-01 MED ORDER — MAGNESIUM HYDROXIDE 400 MG/5ML PO SUSP: 30.0000 mL | Freq: Every day | ORAL | Status: DC | PRN

## 2020-04-01 MED ORDER — TOCILIZUMAB 400 MG/20ML IV SOLN: 8.0000 mg/kg | Freq: Once | INTRAVENOUS | Status: DC

## 2020-04-01 MED ORDER — ONDANSETRON HCL 4 MG PO TABS: 4.0000 mg | ORAL_TABLET | Freq: Four times a day (QID) | ORAL | Status: DC | PRN

## 2020-04-01 MED ORDER — GUAIFENESIN ER 600 MG PO TB12
600.0000 mg | ORAL_TABLET | Freq: Two times a day (BID) | ORAL | Status: DC
  Administered 2020-04-01 – 2020-04-13 (×24): 600 mg via ORAL
  Filled 2020-04-01 (×24): qty 1

## 2020-04-01 MED ORDER — BARICITINIB 2 MG PO TABS
4.0000 mg | ORAL_TABLET | Freq: Every day | ORAL | Status: DC
  Filled 2020-04-01: qty 2

## 2020-04-01 MED ORDER — LACTATED RINGERS IV BOLUS
1000.0000 mL | Freq: Once | INTRAVENOUS | Status: AC
  Administered 2020-04-01: 1000 mL via INTRAVENOUS

## 2020-04-01 MED ORDER — SOTALOL HCL 80 MG PO TABS
80.0000 mg | ORAL_TABLET | Freq: Two times a day (BID) | ORAL | Status: DC
  Filled 2020-04-01 (×2): qty 1

## 2020-04-01 MED ORDER — SODIUM CHLORIDE 0.9 % IV SOLN: INTRAVENOUS | Status: DC

## 2020-04-01 NOTE — H&P (Signed)
[place]   PATIENT NAME: Johnny Shepard    MR#:  [id number]  DATE OF BIRTH:  01-20-1951  DATE OF ADMISSION:  2020/04/12  PRIMARY CARE PHYSICIAN: No primary care provider on file.   REQUESTING/REFERRING PHYSICIAN: Chesley Noon, MD  CHIEF COMPLAINT:   Chief Complaint  Patient presents with  . Shortness of Breath    HISTORY OF PRESENT ILLNESS:  Johnny Shepard  is a 69 y.o. Caucasian male with a known history of atrial fibrillation, who presented to the emergency room with acute onset of worsening dyspnea with associated fatigue and tiredness.  He tested positive for COVID-19 4 days ago and prior to that he has been feeling bad since Saturday 8 days ago.  He was not vaccinated for COVID-19.  He admitted to intermittent fever and chills with a T-max of 101.  No loss of taste or smell.  No nausea vomiting or diarrhea.  He admitted to mild dry cough with occasional wheezing.  He noted his pulse oximetry was low today and his dyspnea started today and got significantly worse before he came to the ER.  No dysuria, oliguria or hematuria or flank pain.  No chest pain or palpitations.  Upon presentation to the emergency room, heart rate was 109 and pulse oximetry was 89% on high flow's nasal cannula and he had added Ventimask with improvement of pulse oximetry to 94%..  Labs revealed mild hyponatremia and hypochloremia and AST 56 with albumin 2.8 and CBC was unremarkable.  High-sensitivity troponin I was 8 and procalcitonin less than 0.1.  Fibrin derivatives D-dimer was 935.68.  Chest x-ray showed minimal patchy pulmonary infiltrates that may be present at the left lung base and within the right midlung zone, possibly infectious or inflammatory in the acute setting.  COVID-19 PCR is currently pending.  EKG showed Atrial fibrillation with mildly rapid ventricular response of 103 with poor R wave progression.  The patient was given IV Decadron and 1 L bolus of IV lactated Ringer.  He will be  admitted to stepdown unit for further evaluation and management. PAST MEDICAL HISTORY:   Past Medical History:  Diagnosis Date  . A-fib (HCC)     PAST SURGICAL HISTORY:   Past Surgical History:  Procedure Laterality Date  . HERNIA REPAIR    . KNEE ARTHROSCOPY WITH MEDIAL MENISECTOMY Left 09/05/2015   Procedure: LEFT KNEE ARTHROSCOPY WITH PARTIAL MEDIAL MENISCECTOMY and synovectomy;  Surgeon: Tarry Kos, MD;  Location: [facility];  Service: Orthopedics;  Laterality: Left;    SOCIAL HISTORY:   Social History   Tobacco Use  . Smoking status: Never Smoker  . Smokeless tobacco: Never Used  Substance Use Topics  . Alcohol use: No    FAMILY HISTORY:  History reviewed. No pertinent family history.  DRUG ALLERGIES:   Allergies  Allergen Reactions  . Bee Venom Shortness Of Breath  . Iodine Shortness Of Breath    REVIEW OF SYSTEMS:   ROS As per history of present illness. All pertinent systems were reviewed above. Constitutional, HEENT, cardiovascular, respiratory, GI, GU, musculoskeletal, neuro, psychiatric, endocrine, integumentary and hematologic systems were reviewed and are otherwise negative/unremarkable except for positive findings mentioned above in the HPI.   MEDICATIONS AT HOME:   Prior to Admission medications   Medication Sig Start Date End Date Taking? Authorizing Provider  aspirin EC 325 MG tablet Take 325 mg by mouth daily.    [provider]  Multiple Vitamins-Minerals (MENS 50+ ADVANCED) CAPS  Take by mouth.    [provider]  sotalol (BETAPACE) 80 MG tablet Take 80 mg by mouth 2 (two) times daily.    [provider]      VITAL SIGNS:  Blood pressure 103/74, pulse 78, temperature 98.5 F (36.9 C), temperature source Oral, resp. rate (!) 24, height 6\' 1"  (1.854 m), weight 79 kg, SpO2 94 %.  PHYSICAL EXAMINATION:  Physical Exam  GENERAL:  69 y.o.-year-old Caucasian male patient lying in the bed with moderate  respiratory distress on high flow nasal cannula with conversational dyspnea.  EYES: Pupils equal, round, reactive to light and accommodation. No scleral icterus. Extraocular muscles intact.  HEENT: Head atraumatic, normocephalic. Oropharynx and nasopharynx clear.  NECK:  Supple, no jugular venous distention. No thyroid enlargement, no tenderness.  LUNGS: Diminished bibasal breath sounds with bibasal crackles. CARDIOVASCULAR: Regular rate and rhythm, S1, S2 normal. No murmurs, rubs, or gallops.  ABDOMEN: Soft, nondistended, nontender. Bowel sounds present. No organomegaly or mass.  EXTREMITIES: No pedal edema, cyanosis, or clubbing.  NEUROLOGIC: Cranial nerves II through XII are intact. Muscle strength 5/5 in all extremities. Sensation intact. Gait not checked.  PSYCHIATRIC: The patient is alert and oriented x 3.  Normal affect and good eye contact. SKIN: No obvious rash, lesion, or ulcer.   LABORATORY PANEL:   CBC Recent Labs  Lab 03/22/2020 1912  WBC 6.0  HGB 14.1  HCT 41.5  PLT 161   ------------------------------------------------------------------------------------------------------------------  Chemistries  Recent Labs  Lab 04/08/2020 1912  NA 133*  K 3.9  CL 97*  CO2 23  GLUCOSE 131*  BUN 28*  CREATININE 0.88  CALCIUM 8.1*  AST 56*  ALT 33  ALKPHOS 30*  BILITOT 0.7   ------------------------------------------------------------------------------------------------------------------  Cardiac Enzymes No results for input(s): TROPONINI in the last 168 hours. ------------------------------------------------------------------------------------------------------------------  RADIOLOGY:  DG Chest Portable 1 View  Result Date: 04/02/2020 CLINICAL DATA:  Dyspnea EXAM: PORTABLE CHEST 1 VIEW COMPARISON:  None. FINDINGS: The lungs are well expanded and are symmetric. Minimal patchy pulmonary infiltrate may be present at the left lung base and within the right mid lung zone,  possibly infectious or inflammatory in the acute setting. There is no pneumothorax or pleural effusion. Cardiac size within normal limits. No acute bone abnormality. IMPRESSION: Minimal patchy pulmonary infiltrate may be present at the left lung base and within the right mid lung zone, possibly infectious or inflammatory in the acute setting. Electronically Signed   By: 04/03/2020 MD   On: 03/25/2020 19:39      IMPRESSION AND PLAN:   1.  Acute hypoxemic respiratory failure secondary to COVID-19. -The patient will be admitted to a medically monitored isolation bed. -O2 protocol will be followed to keep O2 saturation above 93. -The patient will be continued on high flow nasal cannula and Ventimask as appropriate. -Respiratory therapy consult will be obtained.   2.  Multifocal pneumonia secondary to COVID-19. -The patient will be admitted to an isolation monitored bed with droplet and contact precautions. -Given multifocal pneumonia we will empirically place the patient on IV Rocephin and Zithromax for possible bacterial superinfection only with elevated Procalcitonin. -The patient will be placed on scheduled Mucinex and as needed Tussionex. -We will avoid nebulization as much as we can, give bronchodilator MDI if needed, and with deterioration of oxygenation try to avoid BiPAP/CPAP if possible.    -Will obtain sputum Gram stain culture and sensitivity and follow blood cultures. -O2 protocol will be followed. -We will follow CRP, ferritin, LDH  and D-dimer. -Will follow manual differential for ANC/ALC ratio as well as follow troponin I and daily CBC with manual differential and CMP. - Will place the patient on IV Remdesivir and IV steroid therapy with Decadron with elevated inflammatory markers. -The patient will be placed on vitamin D3, vitamin C, zinc sulfate, p.o. Pepcid and aspirin. -I discussed baricitinib with the patient and he agreed to proceed with it.  3.  Atrial fibrillation with  slightly rapid ventricular response. -We will continue Eliquis and aspirin as well as Cardizem CD and Betapace.  4.  Dyslipidemia. -Statin therapy will be resumed.  5.  DVT prophylaxis. -We will continue Eliquis.   All the records are reviewed and case discussed with ED provider. The plan of care was discussed in details with the patient (and family). I answered all questions. The patient agreed to proceed with the above mentioned plan. Further management will depend upon hospital course.   CODE STATUS: Full code  Status is: Inpatient  Remains inpatient appropriate because:Ongoing diagnostic testing needed not appropriate for outpatient work up, Unsafe d/c plan, IV treatments appropriate due to intensity of illness or inability to take PO and Inpatient level of care appropriate due to severity of illness   Dispo: The patient is from: Home              Anticipated d/c is to: Home              Anticipated d/c date is: > 3 days              Patient currently is not medically stable to d/c.   TOTAL TIME TAKING CARE OF THIS PATIENT: 60 minutes.    Hannah Beat M.D on 2020/04/05 at 8:25 PM  [facility]   From 7 PM-7 AM, contact night-coverage [email or web address]  CC: Primary care physician; No primary care provider on file.   Note: This dictation was prepared with Dragon dictation along with smaller phrase technology. Any transcriptional typo errors that result from this process are unintentional.

## 2020-04-01 NOTE — ED Notes (Signed)
Pt given [place] sandwich tray, pt desats when NRB removed after about 5 minutes. Pt drinking fluids as well.

## 2020-04-01 NOTE — ED Triage Notes (Signed)
Pt arrives from home via [facility] with complaint of SOB for a "couple days," pt reports covid positive on 8/18, with symptoms starting 8/14. Pt reports yesterday he "lost all energy," so he called EMS today  Per EMS pt was 78% RA on their arrival, with increase to 91% on 15L NRB. Pt is dyspneic on arrival. Reports history of a fib, and takes a blood thinner   MD and RT called, pt placed on HFNC.

## 2020-04-01 NOTE — ED Provider Notes (Signed)
[facility] Emergency Department Provider Note   ____________________________________________   First MD Initiated Contact with Patient 03/16/2020 1853     (approximate)  I have reviewed the triage vital signs and the nursing notes.   HISTORY  Chief Complaint Shortness of Breath    HPI Johnny Shepard is a 69 y.o. male with past medical history of atrial fibrillation on Eliquis presents to the ED complaining of shortness of breath.  Patient reports that he initially started feeling bad about 9 days ago with cough and malaise.  He subsequently tested positive for COVID-19 on 8/18 at outpatient center.  He states over the past couple of days he has been feeling increasingly fatigued with worsening difficulty breathing.  He checked his oxygen levels at home today and found them to be in the 70s, subsequently called EMS.  On EMS arrival, his O2 sats were 78% on room air and he was placed on a nonrebreather with improvement to 91%.  On arrival to the ED, patient endorses shortness of breath but denies any chest pain, states he has not had any fevers above 101.  He denies any recent vomiting or diarrhea.        Past Medical History:  Diagnosis Date  . A-fib (HCC)     There are no problems to display for this patient.   Past Surgical History:  Procedure Laterality Date  . HERNIA REPAIR    . KNEE ARTHROSCOPY WITH MEDIAL MENISECTOMY Left 09/05/2015   Procedure: LEFT KNEE ARTHROSCOPY WITH PARTIAL MEDIAL MENISCECTOMY and synovectomy;  Surgeon: Tarry Kos, MD;  Location: [facility];  Service: Orthopedics;  Laterality: Left;    Prior to Admission medications   Medication Sig Start Date End Date Taking? Authorizing Provider  aspirin EC 325 MG tablet Take 325 mg by mouth daily.    [provider]  Multiple Vitamins-Minerals (MENS 50+ ADVANCED) CAPS Take by mouth.    [provider]  sotalol (BETAPACE) 80 MG tablet Take 80 mg by mouth  2 (two) times daily.    [provider]    Allergies Bee venom and Iodine  History reviewed. No pertinent family history.  Social History Social History   Tobacco Use  . Smoking status: Never Smoker  . Smokeless tobacco: Never Used  Substance Use Topics  . Alcohol use: No  . Drug use: No    Review of Systems  Constitutional: Positive for fever/chills.  Positive for generalized weakness and fatigue. Eyes: No visual changes. ENT: No sore throat. Cardiovascular: Denies chest pain. Respiratory: Positive for cough and shortness of breath. Gastrointestinal: No abdominal pain.  No nausea, no vomiting.  No diarrhea.  No constipation. Genitourinary: Negative for dysuria. Musculoskeletal: Negative for back pain. Skin: Negative for rash. Neurological: Negative for headaches, focal weakness or numbness.  ____________________________________________   PHYSICAL EXAM:  VITAL SIGNS: ED Triage Vitals  Enc Vitals Group     BP 03/17/2020 1850 93/71     Pulse Rate 03/31/2020 1850 (!) 109     Resp 04/07/2020 1850 16     Temp 04/06/2020 1850 98.5 F (36.9 C)     Temp Source 03/24/2020 1850 Oral     SpO2 03/21/2020 1850 (!) 89 %     Weight 03/26/2020 1852 174 lb 2.6 oz (79 kg)     Height 03/31/2020 1852 6\' 1"  (1.854 m)     Head Circumference --      Peak Flow --  Pain Score 03/15/2020 1852 3     Pain Loc --      Pain Edu? --      Excl. in GC? --     Constitutional: Alert and oriented. Eyes: Conjunctivae are normal. Head: Atraumatic. Nose: No congestion/rhinnorhea. Mouth/Throat: Mucous membranes are moist. Neck: Normal ROM Cardiovascular: Normal rate, regular rhythm. Grossly normal heart sounds. Respiratory: Tachypneic with increased respiratory effort.  No retractions. Lungs CTAB. Gastrointestinal: Soft and nontender. No distention. Genitourinary: deferred Musculoskeletal: No lower extremity tenderness nor edema. Neurologic:  Normal speech and language. No gross focal  neurologic deficits are appreciated. Skin:  Skin is warm, dry and intact. No rash noted. Psychiatric: Mood and affect are normal. Speech and behavior are normal.  ____________________________________________   LABS (all labs ordered are listed, but only abnormal results are displayed)  Labs Reviewed  COMPREHENSIVE METABOLIC PANEL - Abnormal; Notable for the following components:      Result Value   Sodium 133 (*)    Chloride 97 (*)    Glucose, Bld 131 (*)    BUN 28 (*)    Calcium 8.1 (*)    Albumin 2.8 (*)    AST 56 (*)    Alkaline Phosphatase 30 (*)    All other components within normal limits  FIBRIN DERIVATIVES D-DIMER ([facility] ONLY) - Abnormal; Notable for the following components:   Fibrin derivatives D-dimer ([facility]) 935.68 (*)    All other components within normal limits  SARS CORONAVIRUS 2 BY RT PCR (HOSPITAL ORDER, PERFORMED IN [facility])  CBC WITH DIFFERENTIAL/PLATELET  PROCALCITONIN  C-REACTIVE PROTEIN  FERRITIN  TROPONIN I (HIGH SENSITIVITY)   ____________________________________________  EKG  ED ECG REPORT I, Chesley Noon, the attending physician, personally viewed and interpreted this ECG.   Date: 04/02/2020  EKG Time: 18:41  Rate: 103  Rhythm: atrial fibrillation, rate 103  Axis: LAD  Intervals:none  ST&T Change: None   PROCEDURES  Procedure(s) performed (including Critical Care):  .Critical Care Performed by: Chesley Noon, MD Authorized by: Chesley Noon, MD   Critical care provider statement:    Critical care time (minutes):  45   Critical care time was exclusive of:  Separately billable procedures and treating other patients and teaching time   Critical care was necessary to treat or prevent imminent or life-threatening deterioration of the following conditions:  Respiratory failure   Critical care was time spent personally by me on the following activities:  Discussions with consultants, evaluation of patient's response to  treatment, examination of patient, ordering and performing treatments and interventions, ordering and review of laboratory studies, ordering and review of radiographic studies, pulse oximetry, re-evaluation of patient's condition, obtaining history from patient or surrogate and review of old charts   I assumed direction of critical care for this patient from another provider in my specialty: no       ____________________________________________   INITIAL IMPRESSION / ASSESSMENT AND PLAN / ED COURSE       69 year old male with past history of atrial fibrillation on Eliquis and status post watchman device who presents to the ED complaining of increasing shortness of breath following diagnosis of COVID-19 4 days ago.  He was noted to have O2 sats in the 70s by EMS, borderline hypoxic here in the ED despite nonrebreather and was transitioned to high flow nasal cannula with improvement.  He is otherwise well-appearing and not in any respiratory distress at this time, EKG is remarkable for atrial fibrillation without any ischemic changes.  Labs  and chest x-ray are pending, plan for admission for further management of COVID-19, we will treat with IV Decadron for now.  Patient with desaturation to the high 80s on high flow nasal cannula at 50 L/min, however he remains in no respiratory distress with respiratory rate in the low 20s..  Nonrebreather was added with improvement in oxygen saturations.  Chest x-ray appears consistent with COVID-19 pneumonia and I doubt a bacterial component as he has no leukocytosis, fever, or elevation in procalcitonin.  We will hold off on antibiotics for now, vital signs remained stable.  Case discussed with hospitalist for admission.      ____________________________________________   FINAL CLINICAL IMPRESSION(S) / ED DIAGNOSES  Final diagnoses:  Pneumonia due to COVID-19 virus  Shortness of breath     ED Discharge Orders    None       Note:  This document  was prepared using Dragon voice recognition software and may include unintentional dictation errors.   Chesley Noon, MD 04/04/2020 2021

## 2020-04-01 NOTE — Consult Note (Addendum)
Remdesivir - Pharmacy Brief Note   O:   CXR: Minimal patchy pulmonary infiltrate may be present at the left lung base and within the right mid lung zone, possibly infectious or inflammatory in the acute setting. SpO2: 94 % on HFNC   A/P:  Per notes, he tested positive for COVID-19 on 8/18 at outpatient center.  Remdesivir 200 mg IVPB once followed by 100 mg IVPB daily x 4 days.   Cephus Shelling, PharmD Clinical Pharmacist   2020-04-26 8:30 PM

## 2020-04-02 DIAGNOSIS — U071 COVID-19: Secondary | ICD-10-CM

## 2020-04-02 DIAGNOSIS — R7401 Elevation of levels of liver transaminase levels: Secondary | ICD-10-CM | POA: Diagnosis present

## 2020-04-02 DIAGNOSIS — I482 Chronic atrial fibrillation, unspecified: Secondary | ICD-10-CM

## 2020-04-02 DIAGNOSIS — D696 Thrombocytopenia, unspecified: Secondary | ICD-10-CM

## 2020-04-02 LAB — GLUCOSE, CAPILLARY: Glucose-Capillary: 145 mg/dL — ABNORMAL HIGH (ref 70–99)

## 2020-04-02 LAB — FERRITIN: Ferritin: 1551 ng/mL — ABNORMAL HIGH (ref 24–336)

## 2020-04-02 LAB — CBC WITH DIFFERENTIAL/PLATELET
Abs Immature Granulocytes: 0.04 10*3/uL (ref 0.00–0.07)
Basophils Absolute: 0 10*3/uL (ref 0.0–0.1)
Basophils Relative: 0 %
Eosinophils Absolute: 0 10*3/uL (ref 0.0–0.5)
Eosinophils Relative: 0 %
HCT: 36.7 % — ABNORMAL LOW (ref 39.0–52.0)
Hemoglobin: 12.1 g/dL — ABNORMAL LOW (ref 13.0–17.0)
Immature Granulocytes: 1 %
Lymphocytes Relative: 17 %
Lymphs Abs: 0.5 10*3/uL — ABNORMAL LOW (ref 0.7–4.0)
MCH: 30 pg (ref 26.0–34.0)
MCHC: 33 g/dL (ref 30.0–36.0)
MCV: 90.8 fL (ref 80.0–100.0)
Monocytes Absolute: 0.2 10*3/uL (ref 0.1–1.0)
Monocytes Relative: 6 %
Neutro Abs: 2.5 10*3/uL (ref 1.7–7.7)
Neutrophils Relative %: 76 %
Platelets: 142 10*3/uL — ABNORMAL LOW (ref 150–400)
RBC: 4.04 MIL/uL — ABNORMAL LOW (ref 4.22–5.81)
RDW: 14 % (ref 11.5–15.5)
Smear Review: NORMAL
WBC: 3.2 10*3/uL — ABNORMAL LOW (ref 4.0–10.5)
nRBC: 0 % (ref 0.0–0.2)

## 2020-04-02 LAB — TROPONIN I (HIGH SENSITIVITY): Troponin I (High Sensitivity): 7 ng/L

## 2020-04-02 LAB — HIV ANTIBODY (ROUTINE TESTING W REFLEX): HIV Screen 4th Generation wRfx: NONREACTIVE

## 2020-04-02 LAB — C-REACTIVE PROTEIN: CRP: 4.8 mg/dL — ABNORMAL HIGH

## 2020-04-02 LAB — ABO/RH: ABO/RH(D): A POS

## 2020-04-02 LAB — MRSA PCR SCREENING: MRSA by PCR: NEGATIVE

## 2020-04-02 LAB — FIBRIN DERIVATIVES D-DIMER (ARMC ONLY): Fibrin derivatives D-dimer (ARMC): 810.78 ng{FEU}/mL — ABNORMAL HIGH (ref 0.00–499.00)

## 2020-04-02 LAB — MAGNESIUM: Magnesium: 2 mg/dL (ref 1.7–2.4)

## 2020-04-02 MED ORDER — CHLORHEXIDINE GLUCONATE CLOTH 2 % EX PADS
6.0000 | MEDICATED_PAD | Freq: Every day | CUTANEOUS | Status: DC
  Administered 2020-04-02 – 2020-04-15 (×14): 6 via TOPICAL

## 2020-04-02 MED ORDER — DILTIAZEM HCL ER COATED BEADS 180 MG PO CP24
180.0000 mg | ORAL_CAPSULE | Freq: Two times a day (BID) | ORAL | Status: DC
  Administered 2020-04-02 – 2020-04-14 (×22): 180 mg via ORAL
  Filled 2020-04-02 (×30): qty 1

## 2020-04-02 MED ORDER — ORAL CARE MOUTH RINSE
15.0000 mL | Freq: Two times a day (BID) | OROMUCOSAL | Status: DC
  Administered 2020-04-03 – 2020-04-13 (×17): 15 mL via OROMUCOSAL

## 2020-04-02 MED ORDER — METHYLPREDNISOLONE SODIUM SUCC 125 MG IJ SOLR
80.0000 mg | Freq: Two times a day (BID) | INTRAMUSCULAR | Status: DC
  Administered 2020-04-02 – 2020-04-06 (×8): 80 mg via INTRAVENOUS
  Filled 2020-04-02 (×8): qty 2

## 2020-04-02 MED ORDER — CHLORHEXIDINE GLUCONATE 0.12 % MT SOLN
15.0000 mL | Freq: Two times a day (BID) | OROMUCOSAL | Status: DC
  Administered 2020-04-02 – 2020-04-13 (×22): 15 mL via OROMUCOSAL
  Filled 2020-04-02 (×19): qty 15

## 2020-04-02 MED ORDER — DILTIAZEM HCL ER COATED BEADS 180 MG PO CP24
180.0000 mg | ORAL_CAPSULE | Freq: Two times a day (BID) | ORAL | Status: DC
  Filled 2020-04-02: qty 1

## 2020-04-02 MED ORDER — BARICITINIB 2 MG PO TABS
4.0000 mg | ORAL_TABLET | Freq: Every day | ORAL | Status: DC
  Administered 2020-04-02 – 2020-04-13 (×12): 4 mg via ORAL
  Filled 2020-04-02 (×4): qty 2
  Filled 2020-04-02: qty 4
  Filled 2020-04-02: qty 2
  Filled 2020-04-02: qty 4
  Filled 2020-04-02 (×6): qty 2

## 2020-04-02 MED ORDER — APIXABAN 2.5 MG PO TABS
2.5000 mg | ORAL_TABLET | Freq: Two times a day (BID) | ORAL | Status: DC
  Administered 2020-04-02 – 2020-04-06 (×9): 2.5 mg via ORAL
  Filled 2020-04-02 (×10): qty 1

## 2020-04-02 MED ORDER — APIXABAN 2.5 MG PO TABS
2.5000 mg | ORAL_TABLET | Freq: Two times a day (BID) | ORAL | Status: DC
  Filled 2020-04-02: qty 1

## 2020-04-02 NOTE — Assessment & Plan Note (Addendum)
--  appears about the same today, remains on HHF 50L, NRB w/ SpO2 in high 80s --8/23 CXR Minimal patchy pulmonary infiltrate may be present at the left lung base and within the right mid lung zone --oxygen HHF 50L, NRB --inflammatory markers . Ferritin 1605 > 1551 > 1558 . CRP 5.9 > 4.8 > 2.5 . Fibrin derivatives 935 > 819 > 1437 --Tx . Remdesivir 8/22 > . Steroids 8/22 >  . Baricitinib 8/23 > . Prone . Mobility as tolerated

## 2020-04-02 NOTE — Progress Notes (Signed)
Helped turn to stomach to prone position

## 2020-04-02 NOTE — Assessment & Plan Note (Addendum)
S/p Watchman procedure at [facility] --stable; continue apixaban, diltiazem

## 2020-04-02 NOTE — Assessment & Plan Note (Signed)
--  plan as above 

## 2020-04-02 NOTE — Progress Notes (Signed)
PROGRESS NOTE  Johnny DALEO UTM:[id number] DOB: October 22, 1950 DOA: 04/02/2020 PCP: Patient, No Pcp Per  Brief History   69 year old man PMH atrial fibrillation presented with shortness of breath, recent outpatient diagnosis with Covid.  Admitted for acute hypoxic respiratory failure secondary to Covid multifocal pneumonia.  A & P  Acute hypoxemic respiratory failure due to COVID-19 [facility]) --appears comfortable, but on HHF, NRB --CXR Minimal patchy pulmonary infiltrate may be present at the left lung base and within the right mid lung zone --oxygen HHF 50L, NRB --inflammatory markers . Ferritin 1605 > 1551 . CRP 5.9 > 4.8 . Fibrin derivatives 935 > 819 --Tx . Remdesiver 8/22 > . Steroids 8/22 >  . Baricitinib 8/23 >  Unspecified atrial fibrillation (HCC) --stable; continue apixaban, diltiazem  Pneumonia due to COVID-19 virus --plan as above  Thrombocytopenia (HCC) --secondary to acute illness, monitore  Elevated transaminase level --daily CMP   Disposition Plan:  Discussion: Currently appears comfortable but remain in stepdown given high oxygen requirement.  At risk for need for BiPAP.  Continue therapy as above.  Critically ill.  Status is: Inpatient  Remains inpatient appropriate because:Inpatient level of care appropriate due to severity of illness   Dispo: The patient is from: Home              Anticipated d/c is to: Home              Anticipated d/c date is: > 3 days              Patient currently is not medically stable to d/c.  DVT prophylaxis: apixaban (ELIQUIS) tablet 2.5 mg Start: 04/02/20 1000 apixaban (ELIQUIS) tablet 2.5 mg  Code Status: Full Family Communication: none  Brendia Sacks, MD  [facility] Direct contact: see [email or web address] (further directions at bottom of note if needed) 7PM-7AM contact night coverage as at bottom of note 04/02/2020, 12:30 PM  LOS: 1 day   Significant Hospital Events   . 8/22 admit for COVID, hypoxia   Consults:    .    Procedures:  .   Significant Diagnostic Tests:  Marland Kitchen    Micro Data:  .    Antimicrobials:  .   Interval History/Subjective  Feels ok, breathing ok, no pain; has an appetite  Objective   Vitals:  Vitals:   04/02/20 0915 04/02/20 1138  BP: (!) 123/95   Pulse: 88   Resp: (!) 22   Temp: 97.7 F (36.5 C)   SpO2: (!) 87% 98%    Exam:  Constitutional:   . Appears calm and comfortable ENMT:  . grossly normal hearing  Respiratory:  . CTA bilaterally, no w/r/r.  . Respiratory effort mildly increased . HHF at 50L, NRB Cardiovascular:  . RRR, no m/r/g . No LE extremity edema   Skin:  . No rashes, lesions, ulcers Psychiatric:  . Mental status o Mood, affect appropriate  I have personally reviewed the following:   Today's Data  . CBC noted  Scheduled Meds: . apixaban  2.5 mg Oral BID  . vitamin C  500 mg Oral Daily  . aspirin EC  325 mg Oral Daily  . baricitinib  4 mg Oral Daily  . chlorhexidine  15 mL Mouth Rinse BID  . Chlorhexidine Gluconate Cloth  6 each Topical Daily  . cholecalciferol  1,000 Units Oral Daily  . diltiazem  180 mg Oral Q12H  . famotidine  20 mg Oral BID  . guaiFENesin  600 mg Oral BID  .  mouth rinse  15 mL Mouth Rinse q12n4p  . methylPREDNISolone (SOLU-MEDROL) injection  80 mg Intravenous Q12H  . zinc sulfate  220 mg Oral Daily   Continuous Infusions: . sodium chloride 100 mL/hr at 04/02/20 0141  . remdesivir 100 mg in NS 100 mL      Principal Problem:   Acute hypoxemic respiratory failure due to COVID-19 [facility]) Active Problems:   Pneumonia due to COVID-19 virus   Elevated transaminase level   Unspecified atrial fibrillation (HCC)   Thrombocytopenia (HCC)   LOS: 1 day   How to contact the [facility] Attending or Consulting provider 7A - 7P or covering provider during after hours 7P -7A, for this patient?  1. Check the care team in [facility] and look for a) attending/consulting TRH provider listed and b) the [facility] team listed 2. Log into  [email or web address] and use [place]'s universal password to access. If you do not have the password, please contact the hospital operator. 3. Locate the [facility] provider you are looking for under [facility] and page to a number that you can be directly reached. 4. If you still have difficulty reaching the provider, please page the [facility] (Director on Call) for the Hospitalists listed on amion for assistance.

## 2020-04-02 NOTE — Progress Notes (Signed)
Patient proned, requested to turn back to supine to use urinal. Assisted patient with turning.

## 2020-04-02 NOTE — Hospital Course (Addendum)
69 year old man PMH atrial fibrillation presented with shortness of breath, recent outpatient diagnosis with Covid.  Admitted for acute hypoxic respiratory failure secondary to Covid multifocal pneumonia.

## 2020-04-02 NOTE — Assessment & Plan Note (Addendum)
--  secondary to acute illness, resolved.

## 2020-04-02 NOTE — ED Notes (Signed)
Pt resting quietly on stretcher, warm blankets given, pt states he has been able to get some rest. Pt advised I would be back for 5am labs around 5 or so.

## 2020-04-02 NOTE — Assessment & Plan Note (Addendum)
--  AST slightly decreased. Will monitor w/ daily CMP

## 2020-04-02 NOTE — Progress Notes (Addendum)
Attempted to wean patient off of NRM but patients sats dropped into the low 80's upper 70's within a minute or so. NRM replaced and sats rose back into the mid 90's.

## 2020-04-03 LAB — CBC WITH DIFFERENTIAL/PLATELET
Abs Immature Granulocytes: 0.05 10*3/uL (ref 0.00–0.07)
Basophils Absolute: 0 10*3/uL (ref 0.0–0.1)
Basophils Relative: 0 %
Eosinophils Absolute: 0 10*3/uL (ref 0.0–0.5)
Eosinophils Relative: 0 %
HCT: 39.4 % (ref 39.0–52.0)
Hemoglobin: 13.4 g/dL (ref 13.0–17.0)
Immature Granulocytes: 1 %
Lymphocytes Relative: 12 %
Lymphs Abs: 0.8 10*3/uL (ref 0.7–4.0)
MCH: 30 pg (ref 26.0–34.0)
MCHC: 34 g/dL (ref 30.0–36.0)
MCV: 88.1 fL (ref 80.0–100.0)
Monocytes Absolute: 0.3 10*3/uL (ref 0.1–1.0)
Monocytes Relative: 4 %
Neutro Abs: 5.3 10*3/uL (ref 1.7–7.7)
Neutrophils Relative %: 83 %
Platelets: 194 10*3/uL (ref 150–400)
RBC: 4.47 MIL/uL (ref 4.22–5.81)
RDW: 13.7 % (ref 11.5–15.5)
Smear Review: NORMAL
WBC: 6.4 10*3/uL (ref 4.0–10.5)
nRBC: 0 % (ref 0.0–0.2)

## 2020-04-03 LAB — COMPREHENSIVE METABOLIC PANEL
ALT: 30 U/L (ref 0–44)
AST: 47 U/L — ABNORMAL HIGH (ref 15–41)
Albumin: 2.5 g/dL — ABNORMAL LOW (ref 3.5–5.0)
Alkaline Phosphatase: 31 U/L — ABNORMAL LOW (ref 38–126)
Anion gap: 11 (ref 5–15)
BUN: 24 mg/dL — ABNORMAL HIGH (ref 8–23)
CO2: 23 mmol/L (ref 22–32)
Calcium: 7.9 mg/dL — ABNORMAL LOW (ref 8.9–10.3)
Chloride: 102 mmol/L (ref 98–111)
Creatinine, Ser: 0.55 mg/dL — ABNORMAL LOW (ref 0.61–1.24)
GFR calc Af Amer: >60 mL/min (ref >60)
GFR calc non Af Amer: >60 mL/min (ref >60)
Glucose, Bld: 141 mg/dL — ABNORMAL HIGH (ref 70–99)
Potassium: 3.9 mmol/L (ref 3.5–5.1)
Sodium: 136 mmol/L (ref 135–145)
Total Bilirubin: 0.7 mg/dL (ref 0.3–1.2)
Total Protein: 5.9 g/dL — ABNORMAL LOW (ref 6.5–8.1)

## 2020-04-03 LAB — FERRITIN: Ferritin: 1558 ng/mL — ABNORMAL HIGH (ref 24–336)

## 2020-04-03 LAB — GLUCOSE, CAPILLARY
Glucose-Capillary: 141 mg/dL — ABNORMAL HIGH (ref 70–99)
Glucose-Capillary: 147 mg/dL — ABNORMAL HIGH (ref 70–99)
Glucose-Capillary: 122 mg/dL — ABNORMAL HIGH (ref 70–99)

## 2020-04-03 LAB — MAGNESIUM: Magnesium: 2.1 mg/dL (ref 1.7–2.4)

## 2020-04-03 LAB — C-REACTIVE PROTEIN: CRP: 2.5 mg/dL — ABNORMAL HIGH (ref <1.0)

## 2020-04-03 LAB — FIBRIN DERIVATIVES D-DIMER (ARMC ONLY): Fibrin derivatives D-dimer (ARMC): 1437.09 ng/mL (FEU) — ABNORMAL HIGH (ref 0.00–499.00)

## 2020-04-03 MED ORDER — INSULIN ASPART 100 UNIT/ML ~~LOC~~ SOLN
0.0000 [IU] | Freq: Three times a day (TID) | SUBCUTANEOUS | Status: DC
  Administered 2020-04-04: 1 [IU] via SUBCUTANEOUS
  Administered 2020-04-04: 2 [IU] via SUBCUTANEOUS
  Administered 2020-04-04 – 2020-04-05 (×2): 1 [IU] via SUBCUTANEOUS
  Administered 2020-04-05: 3 [IU] via SUBCUTANEOUS
  Administered 2020-04-05: 2 [IU] via SUBCUTANEOUS
  Administered 2020-04-06 (×3): 1 [IU] via SUBCUTANEOUS
  Administered 2020-04-07 – 2020-04-08 (×4): 2 [IU] via SUBCUTANEOUS
  Administered 2020-04-08: 1 [IU] via SUBCUTANEOUS
  Administered 2020-04-09: 2 [IU] via SUBCUTANEOUS
  Administered 2020-04-09 – 2020-04-10 (×3): 1 [IU] via SUBCUTANEOUS
  Administered 2020-04-10: 2 [IU] via SUBCUTANEOUS
  Administered 2020-04-11: 1 [IU] via SUBCUTANEOUS
  Administered 2020-04-11: 2 [IU] via SUBCUTANEOUS
  Administered 2020-04-11 – 2020-04-12 (×2): 1 [IU] via SUBCUTANEOUS
  Administered 2020-04-12: 3 [IU] via SUBCUTANEOUS
  Administered 2020-04-12 – 2020-04-13 (×2): 1 [IU] via SUBCUTANEOUS
  Filled 2020-04-03 (×26): qty 1

## 2020-04-03 MED ORDER — INSULIN ASPART 100 UNIT/ML ~~LOC~~ SOLN
0.0000 [IU] | Freq: Every day | SUBCUTANEOUS | Status: DC
  Administered 2020-04-06: 2 [IU] via SUBCUTANEOUS
  Administered 2020-04-07: 4 [IU] via SUBCUTANEOUS
  Administered 2020-04-08: 2 [IU] via SUBCUTANEOUS
  Administered 2020-04-09: 3 [IU] via SUBCUTANEOUS
  Administered 2020-04-10: 2 [IU] via SUBCUTANEOUS
  Administered 2020-04-12: 3 [IU] via SUBCUTANEOUS
  Filled 2020-04-03 (×7): qty 1

## 2020-04-03 MED ORDER — ENSURE ENLIVE PO LIQD
237.0000 mL | Freq: Three times a day (TID) | ORAL | Status: DC
  Administered 2020-04-03 – 2020-04-13 (×24): 237 mL via ORAL

## 2020-04-03 NOTE — Progress Notes (Signed)
Assisted tele visit to patient with family member.  Omarrion Carmer Anderson, RN   

## 2020-04-03 NOTE — Progress Notes (Addendum)
PROGRESS NOTE  HINTON LUELLEN ZDG:[id number] DOB: 1951/03/21 DOA: 04-05-20 PCP: Patient, No Pcp Per  Brief History   69 year old man PMH atrial fibrillation presented with shortness of breath, recent outpatient diagnosis with Covid.  Admitted for acute hypoxic respiratory failure secondary to Covid multifocal pneumonia.  A & P  Acute hypoxemic respiratory failure due to COVID-19 [facility]) --appears about the same today, remains on HHF 50L, NRB w/ SpO2 in high 80s --8/23 CXR Minimal patchy pulmonary infiltrate may be present at the left lung base and within the right mid lung zone --oxygen HHF 50L, NRB --inflammatory markers . Ferritin 1605 > 1551 > 1558 . CRP 5.9 > 4.8 > 2.5 . Fibrin derivatives 935 > 819 > 1437 --Tx . Remdesivir 8/22 > . Steroids 8/22 >  . Baricitinib 8/23 > . Prone . Mobility as tolerated  Unspecified atrial fibrillation (HCC) S/p Watchman procedure at [facility] --stable; continue apixaban, diltiazem  Pneumonia due to COVID-19 virus --plan as above  Thrombocytopenia (HCC) --secondary to acute illness, resolved.  Elevated transaminase level --AST slightly decreased. Will monitor w/ daily CMP  Disposition Plan:  Discussion: appears about the same today; feels about the same; still w/ high oxygen requirement, currently at max w/ sats high 80s. Inflammatory markers mixed. Appears relatively comfortable. No indication for BiPAP currently. Continue current Rx, remain in SDU.  Status is: Inpatient  Remains inpatient appropriate because:Inpatient level of care appropriate due to severity of illness   Dispo: The patient is from: Home              Anticipated d/c is to: Home              Anticipated d/c date is: > 3 days              Patient currently is not medically stable to d/c.  DVT prophylaxis: apixaban (ELIQUIS) tablet 2.5 mg Start: 04/02/20 1000 apixaban (ELIQUIS) tablet 2.5 mg  Code Status: Full Family Communication: updated wife by telephone  Brendia Sacks, MD  [facility] Direct contact: see [email or web address] (further directions at bottom of note if needed) 7PM-7AM contact night coverage as at bottom of note 04/03/2020, 4:22 PM  LOS: 2 days   Significant Hospital Events   . 8/22 admit for COVID, hypoxia   Consults:  .    Procedures:  .   Significant Diagnostic Tests:  Marland Kitchen    Micro Data:  .    Antimicrobials:  .   Interval History/Subjective  Feels about the same; still SOB, tolerating diet. Proned all night.  Objective   Vitals:  Vitals:   04/03/20 1000 04/03/20 1008  BP: (!) 115/91 (!) 115/91  Pulse: 89   Resp: (!) 26   Temp:    SpO2: (!) 85%     Exam:  Constitutional:   . Appears calm, mild uncomfortable HHF at 50, NRB ENMT:  . grossly normal hearing  Respiratory:  . CTA bilaterally, no w/r/r.  . Respiratory effort moderately increased; tachypnic, dyspneic  Cardiovascular:  . RRR, no m/r/g . No LE extremity edema   Abdomen:  . Soft  Psychiatric:  . Mental status o Mood, affect appropriate  I have personally reviewed the following:   Today's Data  . Glucose 141 . AST 47, improved . CBC unremarkable  Scheduled Meds: . apixaban  2.5 mg Oral BID  . vitamin C  500 mg Oral Daily  . aspirin EC  325 mg Oral Daily  . baricitinib  4 mg Oral Daily  .  chlorhexidine  15 mL Mouth Rinse BID  . Chlorhexidine Gluconate Cloth  6 each Topical Daily  . cholecalciferol  1,000 Units Oral Daily  . diltiazem  180 mg Oral Q12H  . famotidine  20 mg Oral BID  . feeding supplement (ENSURE ENLIVE)  237 mL Oral TID BM  . guaiFENesin  600 mg Oral BID  . insulin aspart  0-5 Units Subcutaneous QHS  . insulin aspart  0-6 Units Subcutaneous TID WC  . mouth rinse  15 mL Mouth Rinse q12n4p  . methylPREDNISolone (SOLU-MEDROL) injection  80 mg Intravenous Q12H  . zinc sulfate  220 mg Oral Daily   Continuous Infusions: . remdesivir 100 mg in NS 100 mL 100 mg (04/03/20 1000)    Principal Problem:   Acute hypoxemic  respiratory failure due to COVID-19 [facility]) Active Problems:   Pneumonia due to COVID-19 virus   Elevated transaminase level   Unspecified atrial fibrillation (HCC)   Thrombocytopenia (HCC)   LOS: 2 days   How to contact the [facility] Attending or Consulting provider 7A - 7P or covering provider during after hours 7P -7A, for this patient?  1. Check the care team in [facility] and look for a) attending/consulting TRH provider listed and b) the [facility] team listed 2. Log into [email or web address] and use [place]'s universal password to access. If you do not have the password, please contact the hospital operator. 3. Locate the [facility] provider you are looking for under [facility] and page to a number that you can be directly reached. 4. If you still have difficulty reaching the provider, please page the [facility] (Director on Call) for the Hospitalists listed on amion for assistance.

## 2020-04-03 NOTE — Progress Notes (Signed)
Initial Nutrition Assessment  DOCUMENTATION CODES:   Not applicable  INTERVENTION:  Plan is to liberalize diet to regular.  Provide Ensure Enlive po TID, each supplement provides 350 kcal and 20 grams of protein.  Provide Magic cup BID with lunch and dinner, each supplement provides 290 kcal and 9 grams of protein.  NUTRITION DIAGNOSIS:   Increased nutrient needs related to catabolic illness (COVID-19) as evidenced by estimated needs.  GOAL:   Patient will meet greater than or equal to 90% of their needs  MONITOR:   PO intake, Supplement acceptance, Labs, Weight trends, I & O's  REASON FOR ASSESSMENT:   Malnutrition Screening Tool    ASSESSMENT:   69 year old male with PMHx of A-fib admitted with COVID-19 PNA.   Discussed with RN in ICU. Patient is too short of breathe to hold conversation over the phone at this time. Patient was on HFNC with NRB and unable to come off NRB. She reports his appetite is fair. According to chart patient ate 75% of his breakfast yesterday and 60% of his lunch yesterday. RN reports he ate fairly well at breakfast today. Patient with increased calorie/protein needs related to COVID-19 and would benefit from oral nutrition supplements.  Patient is currently documented to be 84.7 kg (186.73 lbs). Only other weight in chart was 88 kg on 09/05/2015 so there is no recent weight history to trend.  Medications reviewed and include: vitamin C 500 mg daily, vitamin D3 1000 units daily, famotidine, Novolog 0-5 units QHS, Novolog 0-6 units TID, Solu-Medrol 80 mg Q12hrs IV, zinc sulfate 220 mg daily, remdesivir.  Labs reviewed: CBG 145, BUN 24, Creatinine 0.55.  Unable to determine if patient meets criteria for malnutrition at this time.  Discussed with MD via secure chat and plan is to liberalize diet to regular today.  NUTRITION - FOCUSED PHYSICAL EXAM:  Deferred.  Diet Order:   Diet Order            Diet regular Room service appropriate? Yes;  Fluid consistency: Thin  Diet effective now                EDUCATION NEEDS:   No education needs have been identified at this time  Skin:  Skin Assessment: Reviewed RN Assessment  Last BM:  04/07/2020 per chart  Height:   Ht Readings from Last 1 Encounters:  04/02/20 6' (1.829 m)   Weight:   Wt Readings from Last 1 Encounters:  04/02/20 84.7 kg   BMI:  Body mass index is 25.33 kg/m.  Estimated Nutritional Needs:   Kcal:  2200-2400  Protein:  115-125 grams  Fluid:  >/= 2.2 L/day  Felix Pacini, MS, RD, LDN Pager number available on Amion

## 2020-04-04 ENCOUNTER — Inpatient Hospital Stay: Payer: Medicare Other

## 2020-04-04 LAB — CBC WITH DIFFERENTIAL/PLATELET
Abs Immature Granulocytes: 0.07 10*3/uL (ref 0.00–0.07)
Basophils Absolute: 0 10*3/uL (ref 0.0–0.1)
Basophils Relative: 0 %
Eosinophils Absolute: 0 10*3/uL (ref 0.0–0.5)
Eosinophils Relative: 0 %
HCT: 38.7 % — ABNORMAL LOW (ref 39.0–52.0)
Hemoglobin: 13.2 g/dL (ref 13.0–17.0)
Immature Granulocytes: 1 %
Lymphocytes Relative: 9 %
Lymphs Abs: 0.8 10*3/uL (ref 0.7–4.0)
MCH: 30.2 pg (ref 26.0–34.0)
MCHC: 34.1 g/dL (ref 30.0–36.0)
MCV: 88.6 fL (ref 80.0–100.0)
Monocytes Absolute: 0.3 10*3/uL (ref 0.1–1.0)
Monocytes Relative: 4 %
Neutro Abs: 7.7 10*3/uL (ref 1.7–7.7)
Neutrophils Relative %: 86 %
Platelets: 222 10*3/uL (ref 150–400)
RBC: 4.37 MIL/uL (ref 4.22–5.81)
RDW: 13.8 % (ref 11.5–15.5)
Smear Review: NORMAL
WBC: 8.9 10*3/uL (ref 4.0–10.5)
nRBC: 0 % (ref 0.0–0.2)

## 2020-04-04 LAB — COMPREHENSIVE METABOLIC PANEL
ALT: 47 U/L — ABNORMAL HIGH (ref 0–44)
AST: 61 U/L — ABNORMAL HIGH (ref 15–41)
Albumin: 2.6 g/dL — ABNORMAL LOW (ref 3.5–5.0)
Alkaline Phosphatase: 38 U/L (ref 38–126)
Anion gap: 9 (ref 5–15)
BUN: 27 mg/dL — ABNORMAL HIGH (ref 8–23)
CO2: 24 mmol/L (ref 22–32)
Calcium: 8 mg/dL — ABNORMAL LOW (ref 8.9–10.3)
Chloride: 100 mmol/L (ref 98–111)
Creatinine, Ser: 0.71 mg/dL (ref 0.61–1.24)
GFR calc Af Amer: >60 mL/min (ref >60)
GFR calc non Af Amer: >60 mL/min (ref >60)
Glucose, Bld: 146 mg/dL — ABNORMAL HIGH (ref 70–99)
Potassium: 4.2 mmol/L (ref 3.5–5.1)
Sodium: 133 mmol/L — ABNORMAL LOW (ref 135–145)
Total Bilirubin: 1 mg/dL (ref 0.3–1.2)
Total Protein: 6 g/dL — ABNORMAL LOW (ref 6.5–8.1)

## 2020-04-04 LAB — FIBRIN DERIVATIVES D-DIMER (ARMC ONLY): Fibrin derivatives D-dimer (ARMC): 2608.68 ng/mL (FEU) — ABNORMAL HIGH (ref 0.00–499.00)

## 2020-04-04 LAB — GLUCOSE, CAPILLARY
Glucose-Capillary: 156 mg/dL — ABNORMAL HIGH (ref 70–99)
Glucose-Capillary: 181 mg/dL — ABNORMAL HIGH (ref 70–99)
Glucose-Capillary: 213 mg/dL — ABNORMAL HIGH (ref 70–99)
Glucose-Capillary: 156 mg/dL — ABNORMAL HIGH (ref 70–99)

## 2020-04-04 LAB — C-REACTIVE PROTEIN: CRP: 1.1 mg/dL — ABNORMAL HIGH (ref <1.0)

## 2020-04-04 LAB — MAGNESIUM: Magnesium: 2.3 mg/dL (ref 1.7–2.4)

## 2020-04-04 LAB — FERRITIN: Ferritin: 1456 ng/mL — ABNORMAL HIGH (ref 24–336)

## 2020-04-04 NOTE — Progress Notes (Signed)
Pt has remained alert and oriented throughout my shift. Pt still on 100% 50L HFNC and NRB. Afib rate of 50-70's. Pt got [place] of Lower ext per order. Helped pt to prone for about an hour before he ate dinner. Will continue to monitor.

## 2020-04-04 NOTE — Progress Notes (Signed)
Johnny Shepard  JQG:[id number] DOB: 07-30-1951 DOA: 03/16/2020 PCP: Patient, No Pcp Per   Brief Narrative:  69 year old man PMH atrial fibrillation presented with shortness of breath, recent outpatient diagnosis with Covid.  Admitted for acute hypoxic respiratory failure secondary to Covid multifocal pneumonia.  8/25: Patient seen and examined.  Lying prone in bed.  Still on 50 L high flow in addition nonrebreather.  Saturations in the 90s on above oxygen.  Mentating clearly.  Normal work of breathing.   Assessment & Plan:   Principal Problem:   Acute hypoxemic respiratory failure due to COVID-19 [facility]) Active Problems:   Pneumonia due to COVID-19 virus   Unspecified atrial fibrillation (HCC)   Thrombocytopenia (HCC)   Elevated transaminase level  Acute hypoxemic respiratory failure due to COVID-19 [facility]) --appears about the same today, remains on HHF 50L, NRB w/ SpO2 in high 80s --8/23 CXR Minimal patchy pulmonary infiltrate may be present at the left lung base and within the right mid lung zone --oxygen HHF 50L, NRB --inflammatory markers  Ferritin 1605 > 1551 > 1558>1456  CRP 5.9 > 4.8 > 2.5 >1.1  Fibrin derivatives 935 > 819 > 1437 >2608 --Tx  Remdesivir 8/22 >  Steroids 8/22 >   Baricitinib 8/23 >  Prone  Mobility as tolerated  Given uptrending D-dimer will attempt to rule out VTE.  CTA may not be possible with severe hypoxia.  If this cannot be accommodated we will pursue bilateral lower extremity Dopplers  Unspecified atrial fibrillation (HCC) S/p Watchman procedure at [facility] --stable; continue apixaban, diltiazem  Pneumonia due to COVID-19 virus --plan as above  Thrombocytopenia (HCC) --secondary to acute illness, resolved.  Elevated transaminase level --AST slightly decreased. Will monitor w/ daily CMP   DVT prophylaxis: Eliquis Code Status: Full Family Communication: Wife via phone [phone] on 04/04/2020 Disposition  Plan:Status is: Inpatient  Remains inpatient appropriate because:Inpatient level of care appropriate due to severity of illness   Dispo: The patient is from: Home              Anticipated d/c is to: Home              Anticipated d/c date is: > 3 days              Patient currently is not medically stable to d/c.    severe acute hypoxic respiratory failure in the setting of COVID-19 pneumonia I have concern for acute VTE given uptrending dimer.  Attempt to get CTA thorax however if this is not possible given marked hypoxia we will pursue bilateral lower extremity Doppler.     Consultants:   None  Procedures:   None  Antimicrobials:   Remdesivir   Subjective: Patient seen and examined.  Still on 50L heated high flow nasal cannula and 15 L nonrebreather.  Actually work of breathing is acceptable.  Patient is mentating clearly.  Objective: Vitals:   04/04/20 1200 04/04/20 1300 04/04/20 1400 04/04/20 1500  BP: 118/75 117/87 90/78 104/75  Pulse: 69 72 89 81  Resp: (!) 23 (!) 21 (!) 24 (!) 22  Temp:  97.6 F (36.4 C)    TempSrc:  Axillary    SpO2: 93% 93% 98% 96%  Weight:      Height:        Intake/Output Summary (Last 24 hours) at 04/04/2020 1530 Last data filed at 04/04/2020 1300 Gross per 24 hour  Intake --  Output 1900 ml  Net -1900 ml  Filed Weights   2020-05-01 1852 04/02/20 0915  Weight: 79 kg 84.7 kg    Examination:  General exam: Appears calm and comfortable  Respiratory system: Scattered crackles bilaterally.  Normal work of breathing Cardiovascular system: S1 & S2 heard, RRR. No JVD, murmurs, rubs, gallops or clicks. No pedal edema. Gastrointestinal system: Abdomen is nondistended, soft and nontender. No organomegaly or masses felt. Normal bowel sounds heard. Central nervous system: Alert and oriented. No focal neurological deficits. Extremities: Symmetric 5 x 5 power. Skin: No rashes, lesions or ulcers Psychiatry: Judgement and insight appear  normal. Mood & affect appropriate.     Data Reviewed: I have personally reviewed following labs and imaging studies  CBC: Recent Labs  Lab 05/01/20 1912 04/02/20 0511 04/03/20 0422 04/04/20 0338  WBC 6.0 3.2* 6.4 8.9  NEUTROABS 5.0 2.5 5.3 7.7  HGB 14.1 12.1* 13.4 13.2  HCT 41.5 36.7* 39.4 38.7*  MCV 87.6 90.8 88.1 88.6  PLT 161 142* 194 222   Basic Metabolic Panel: Recent Labs  Lab 2020/05/01 1912 04/02/20 0511 04/03/20 0422 04/04/20 0338  NA 133*  --  136 133*  K 3.9  --  3.9 4.2  CL 97*  --  102 100  CO2 23  --  23 24  GLUCOSE 131*  --  141* 146*  BUN 28*  --  24* 27*  CREATININE 0.88  --  0.55* 0.71  CALCIUM 8.1*  --  7.9* 8.0*  MG  --  2.0 2.1 2.3   GFR: Estimated Creatinine Clearance: 95.7 mL/min (by C-G formula based on SCr of 0.71 mg/dL). Liver Function Tests: Recent Labs  Lab 05/01/2020 1912 04/03/20 0422 04/04/20 0338  AST 56* 47* 61*  ALT 33 30 47*  ALKPHOS 30* 31* 38  BILITOT 0.7 0.7 1.0  PROT 6.7 5.9* 6.0*  ALBUMIN 2.8* 2.5* 2.6*   No results for input(s): LIPASE, AMYLASE in the last 168 hours. No results for input(s): AMMONIA in the last 168 hours. Coagulation Profile: No results for input(s): INR, PROTIME in the last 168 hours. Cardiac Enzymes: No results for input(s): CKTOTAL, CKMB, CKMBINDEX, TROPONINI in the last 168 hours. BNP (last 3 results) No results for input(s): PROBNP in the last 8760 hours. HbA1C: No results for input(s): HGBA1C in the last 72 hours. CBG: Recent Labs  Lab 04/03/20 1148 04/03/20 1644 04/03/20 2203 04/04/20 0852 04/04/20 1143  GLUCAP 141* 147* 122* 156* 181*   Lipid Profile: No results for input(s): CHOL, HDL, LDLCALC, TRIG, CHOLHDL, LDLDIRECT in the last 72 hours. Thyroid Function Tests: No results for input(s): TSH, T4TOTAL, FREET4, T3FREE, THYROIDAB in the last 72 hours. Anemia Panel: Recent Labs    04/03/20 0422 04/04/20 0338  FERRITIN 1,558* 1,456*   Sepsis Labs: Recent Labs  Lab  2020-05-01 1912 01-May-2020 2120  PROCALCITON <0.10 <0.10    Recent Results (from the past 240 hour(s))  SARS Coronavirus 2 by RT PCR (hospital order, performed in [facility] hospital lab) Nasopharyngeal Nasopharyngeal Swab     Status: Abnormal   Collection Time: 2020/05/01  7:53 PM   Specimen: Nasopharyngeal Swab  Result Value Ref Range Status   SARS Coronavirus 2 POSITIVE (A) NEGATIVE Final    Comment: RESULT CALLED TO, READ BACK BY AND VERIFIED WITH: LEA FERGUISON 2020-05-01 AT 2115 BY AR (NOTE) SARS-CoV-2 target nucleic acids are DETECTED  SARS-CoV-2 RNA is generally detectable in upper respiratory specimens  during the acute phase of infection.  Positive results are indicative  of the presence of the  identified virus, but do not rule out bacterial infection or co-infection with other pathogens not detected by the test.  Clinical correlation with patient history and  other diagnostic information is necessary to determine patient infection status.  The expected result is negative.  Fact Sheet for Patients:   BoilerBrush.com.cyhttps://www.fda.gov/media/136312/download   Fact Sheet for Healthcare Providers:   https://pope.com/https://www.fda.gov/media/136313/download    This test is not yet approved or cleared by the [place] FDA and  has been authorized for detection and/or diagnosis of SARS-CoV-2 by FDA under an Emergency Use Authorization (EUA).  This EUA will remain in effect (meaning this t est can be used) for the duration of  the COVID-19 declaration under Section 564(b)(1) of the Act, 21 U.S.C. section 360-bbb-3(b)(1), unless the authorization is terminated or revoked sooner.  Performed at [facility], [address] [place], [place] [address]   Culture, blood (Routine X 2) w Reflex to ID Panel     Status: None (Preliminary result)   Collection Time: 04/07/2020  9:20 PM   Specimen: BLOOD  Result Value Ref Range Status   Specimen Description BLOOD BLOOD RIGHT FOREARM  Final   Special  Requests   Final    BOTTLES DRAWN AEROBIC AND ANAEROBIC Blood Culture adequate volume   Culture   Final    NO GROWTH 3 DAYS Performed at [facility], [address] [place], [place] [address]    Report Status PENDING  Incomplete  Culture, blood (Routine X 2) w Reflex to ID Panel     Status: None (Preliminary result)   Collection Time: 04/05/2020  9:31 PM   Specimen: BLOOD  Result Value Ref Range Status   Specimen Description BLOOD RIGHT ANTECUBITAL  Final   Special Requests   Final    BOTTLES DRAWN AEROBIC AND ANAEROBIC Blood Culture results may not be optimal due to an excessive volume of blood received in culture bottles   Culture   Final    NO GROWTH 3 DAYS Performed at [facility], [address] [place], [place] [address]    Report Status PENDING  Incomplete  MRSA PCR Screening     Status: None   Collection Time: 04/02/20 10:00 AM   Specimen: Nasal Mucosa; Nasopharyngeal  Result Value Ref Range Status   MRSA by PCR NEGATIVE NEGATIVE Final    Comment:        The GeneXpert MRSA Assay (FDA approved for NASAL specimens only), is one component of a comprehensive MRSA colonization surveillance program. It is not intended to diagnose MRSA infection nor to guide or monitor treatment for MRSA infections. Performed at [facility], [address] [place], [place] [address]          Radiology Studies: No results found.      Scheduled Meds: . apixaban  2.5 mg Oral BID  . vitamin C  500 mg Oral Daily  . aspirin EC  325 mg Oral Daily  . baricitinib  4 mg Oral Daily  . chlorhexidine  15 mL Mouth Rinse BID  . Chlorhexidine Gluconate Cloth  6 each Topical Daily  . cholecalciferol  1,000 Units Oral Daily  . diltiazem  180 mg Oral Q12H  . famotidine  20 mg Oral BID  . feeding supplement (ENSURE ENLIVE)  237 mL Oral TID BM  . guaiFENesin  600 mg Oral BID  . insulin aspart  0-5 Units Subcutaneous QHS  . insulin aspart  0-6 Units  Subcutaneous TID WC  . mouth rinse  15 mL Mouth Rinse q12n4p  . methylPREDNISolone (SOLU-MEDROL) injection  80 mg Intravenous Q12H  . zinc sulfate  220 mg Oral Daily   Continuous Infusions: . remdesivir 100 mg in NS 100 mL 100 mg (04/04/20 1000)     LOS: 3 days    Time spent: 25 minutes    Tresa Moore, MD [facility] Pager [phone]  If 7PM-7AM, please contact night-coverage 04/04/2020, 3:30 PM

## 2020-04-05 LAB — COMPREHENSIVE METABOLIC PANEL
ALT: 58 U/L — ABNORMAL HIGH (ref 0–44)
AST: 50 U/L — ABNORMAL HIGH (ref 15–41)
Albumin: 2.4 g/dL — ABNORMAL LOW (ref 3.5–5.0)
Alkaline Phosphatase: 45 U/L (ref 38–126)
Anion gap: 10 (ref 5–15)
BUN: 27 mg/dL — ABNORMAL HIGH (ref 8–23)
CO2: 26 mmol/L (ref 22–32)
Calcium: 7.9 mg/dL — ABNORMAL LOW (ref 8.9–10.3)
Chloride: 101 mmol/L (ref 98–111)
Creatinine, Ser: 0.68 mg/dL (ref 0.61–1.24)
GFR calc Af Amer: >60 mL/min (ref >60)
GFR calc non Af Amer: >60 mL/min (ref >60)
Glucose, Bld: 170 mg/dL — ABNORMAL HIGH (ref 70–99)
Potassium: 4.1 mmol/L (ref 3.5–5.1)
Sodium: 137 mmol/L (ref 135–145)
Total Bilirubin: 1 mg/dL (ref 0.3–1.2)
Total Protein: 5.7 g/dL — ABNORMAL LOW (ref 6.5–8.1)

## 2020-04-05 LAB — CBC WITH DIFFERENTIAL/PLATELET
Abs Immature Granulocytes: 0.09 10*3/uL — ABNORMAL HIGH (ref 0.00–0.07)
Basophils Absolute: 0 10*3/uL (ref 0.0–0.1)
Basophils Relative: 0 %
Eosinophils Absolute: 0 10*3/uL (ref 0.0–0.5)
Eosinophils Relative: 0 %
HCT: 38 % — ABNORMAL LOW (ref 39.0–52.0)
Hemoglobin: 13.1 g/dL (ref 13.0–17.0)
Immature Granulocytes: 1 %
Lymphocytes Relative: 7 %
Lymphs Abs: 0.8 10*3/uL (ref 0.7–4.0)
MCH: 30.4 pg (ref 26.0–34.0)
MCHC: 34.5 g/dL (ref 30.0–36.0)
MCV: 88.2 fL (ref 80.0–100.0)
Monocytes Absolute: 0.4 10*3/uL (ref 0.1–1.0)
Monocytes Relative: 4 %
Neutro Abs: 9.7 10*3/uL — ABNORMAL HIGH (ref 1.7–7.7)
Neutrophils Relative %: 88 %
Platelets: 245 10*3/uL (ref 150–400)
RBC: 4.31 MIL/uL (ref 4.22–5.81)
RDW: 13.5 % (ref 11.5–15.5)
Smear Review: NORMAL
WBC: 10.9 10*3/uL — ABNORMAL HIGH (ref 4.0–10.5)
nRBC: 0 % (ref 0.0–0.2)

## 2020-04-05 LAB — HEMOGLOBIN A1C
Hgb A1c MFr Bld: 6.3 % — ABNORMAL HIGH (ref 4.8–5.6)
Mean Plasma Glucose: 134.11 mg/dL

## 2020-04-05 LAB — GLUCOSE, CAPILLARY
Glucose-Capillary: 153 mg/dL — ABNORMAL HIGH (ref 70–99)
Glucose-Capillary: 279 mg/dL — ABNORMAL HIGH (ref 70–99)
Glucose-Capillary: 196 mg/dL — ABNORMAL HIGH (ref 70–99)
Glucose-Capillary: 175 mg/dL — ABNORMAL HIGH (ref 70–99)

## 2020-04-05 LAB — MAGNESIUM: Magnesium: 2.2 mg/dL (ref 1.7–2.4)

## 2020-04-05 LAB — FERRITIN: Ferritin: 1022 ng/mL — ABNORMAL HIGH (ref 24–336)

## 2020-04-05 LAB — FIBRIN DERIVATIVES D-DIMER (ARMC ONLY): Fibrin derivatives D-dimer (ARMC): 6967.95 ng/mL (FEU) — ABNORMAL HIGH (ref 0.00–499.00)

## 2020-04-05 LAB — C-REACTIVE PROTEIN: CRP: 0.7 mg/dL (ref <1.0)

## 2020-04-05 MED ORDER — FUROSEMIDE 10 MG/ML IJ SOLN
40.0000 mg | Freq: Once | INTRAMUSCULAR | Status: AC
  Administered 2020-04-05: 40 mg via INTRAVENOUS
  Filled 2020-04-05: qty 4

## 2020-04-05 NOTE — Progress Notes (Signed)
Assisted wife with tele visit via E-Link.

## 2020-04-05 NOTE — Progress Notes (Signed)
Patient alert and oriented. No complaints of shortness of breath or wheezing. Tolerating diet and using urinal with no complications. Tolerating diet. Patient had bowel movement this afternoon. Patient self prone throughout the day. Wife updated this am. Video chatted with family during lunch. Continue to assess.

## 2020-04-05 NOTE — Progress Notes (Signed)
Pt alert and oriented, self prones. Still on [place] and nrb. VSS

## 2020-04-05 NOTE — Progress Notes (Signed)
PROGRESS NOTE    Johnny Shepard  VPX:[id number] DOB: [birth year] DOA: 04/05/2020 PCP: Patient, No Pcp Per   Brief Narrative:  69 year old man PMH atrial fibrillation presented with shortness of breath, recent outpatient diagnosis with Covid.  Admitted for acute hypoxic respiratory failure secondary to Covid multifocal pneumonia.  8/25: Patient seen and examined.  Lying prone in bed.  Still on 50 L high flow in addition nonrebreather.  Saturations in the 90s on above oxygen.  Mentating clearly.  Normal work of breathing.  8/26: Patient seen and examined.  Clinically appears better today.  Remains on 50 L high flow nasal cannula and nonrebreather.  Saturations mid 90s.  Mentating clearly.  Normal work of breathing   Assessment & Plan:   Principal Problem:   Acute hypoxemic respiratory failure due to COVID-19 [facility]) Active Problems:   Pneumonia due to COVID-19 virus   Unspecified atrial fibrillation (HCC)   Thrombocytopenia (HCC)   Elevated transaminase level  Acute hypoxemic respiratory failure due to COVID-19 [facility]) --appears about the same today, remains on HHF 50L, NRB w/ SpO2 in high 80s --8/23 CXR Minimal patchy pulmonary infiltrate may be present at the left lung base and within the right mid lung zone --oxygen HHF 50L, NRB --inflammatory markers  Ferritin 1605 > 1551 > 1558>1456  CRP 5.9 > 4.8 > 2.5 >1.1  Fibrin derivatives 935 > 819 > 1437 >2608  CTA could not be performed.  Lower extremity Doppler negative for PE --Tx  Remdesivir 8/22 >  Steroids 8/22 >   Baricitinib 8/23 >  Prone  Mobility as tolerated  Attempt Lasix 40 mg IV x1 today.  If this is effective we can continue diuresis on a daily basis  Unspecified atrial fibrillation (HCC) S/p Watchman procedure at [facility] --stable; continue apixaban, diltiazem  Pneumonia due to COVID-19 virus --plan as above  Thrombocytopenia (HCC) --secondary to acute illness, resolved.  Elevated transaminase  level --AST slightly decreased. Will monitor w/ daily CMP   DVT prophylaxis: Eliquis Code Status: Full Family Communication: Wife via phone [phone] on 04/04/2020 Disposition Plan:Status is: Inpatient  Remains inpatient appropriate because:Inpatient level of care appropriate due to severity of illness   Dispo: The patient is from: Home              Anticipated d/c is to: Home              Anticipated d/c date is: > 3 days              Patient currently is not medically stable to d/c.   Severe acute hypoxic respiratory failure.  Secondary to COVID-19 pneumonia.  No lower extremity clots noted.  Attempting diuresis.  Respiratory status remains very tenuous.     Consultants:   None  Procedures:   None  Antimicrobials:   Remdesivir   Subjective: Patient seen and examined.  Still on 50L heated high flow nasal cannula and 15 L nonrebreather.  Clinically appears better today.  Work of breathing decreased.  Mentating clearly  Objective: Vitals:   04/05/20 0800 04/05/20 0900 04/05/20 1000 04/05/20 1310  BP: 119/84 105/82 (!) 150/72 (!) 121/91  Pulse: 80 79 98 81  Resp: 19 (!) 22 (!) 24 16  Temp:   97.7 F (36.5 C)   TempSrc:      SpO2: 93% 92% 97% 92%  Weight:      Height:        Intake/Output Summary (Last 24 hours) at 04/05/2020 1551 Last data filed at  04/05/2020 1300 Gross per 24 hour  Intake --  Output 2775 ml  Net -2775 ml   Filed Weights   04/10/2020 1852 04/02/20 0915  Weight: 79 kg 84.7 kg    Examination:  General exam: Appears calm and comfortable  Respiratory system: Bibasilar crackles.  Normal work of breathing.  Air entry improved from prior Cardiovascular system: S1 & S2 heard, RRR. No JVD, murmurs, rubs, gallops or clicks. No pedal edema. Gastrointestinal system: Abdomen is nondistended, soft and nontender. No organomegaly or masses felt. Normal bowel sounds heard. Central nervous system: Alert and oriented. No focal neurological  deficits. Extremities: Symmetric 5 x 5 power. Skin: No rashes, lesions or ulcers Psychiatry: Judgement and insight appear normal. Mood & affect appropriate.     Data Reviewed: I have personally reviewed following labs and imaging studies  CBC: Recent Labs  Lab 03/20/2020 1912 04/02/20 0511 04/03/20 0422 04/04/20 0338 04/05/20 0408  WBC 6.0 3.2* 6.4 8.9 10.9*  NEUTROABS 5.0 2.5 5.3 7.7 9.7*  HGB 14.1 12.1* 13.4 13.2 13.1  HCT 41.5 36.7* 39.4 38.7* 38.0*  MCV 87.6 90.8 88.1 88.6 88.2  PLT 161 142* 194 222 245   Basic Metabolic Panel: Recent Labs  Lab 03/17/2020 1912 04/02/20 0511 04/03/20 0422 04/04/20 0338 04/05/20 0408  NA 133*  --  136 133* 137  K 3.9  --  3.9 4.2 4.1  CL 97*  --  102 100 101  CO2 23  --  23 24 26   GLUCOSE 131*  --  141* 146* 170*  BUN 28*  --  24* 27* 27*  CREATININE 0.88  --  0.55* 0.71 0.68  CALCIUM 8.1*  --  7.9* 8.0* 7.9*  MG  --  2.0 2.1 2.3 2.2   GFR: Estimated Creatinine Clearance: 95.7 mL/min (by C-G formula based on SCr of 0.68 mg/dL). Liver Function Tests: Recent Labs  Lab 03/14/2020 1912 04/03/20 0422 04/04/20 0338 04/05/20 0408  AST 56* 47* 61* 50*  ALT 33 30 47* 58*  ALKPHOS 30* 31* 38 45  BILITOT 0.7 0.7 1.0 1.0  PROT 6.7 5.9* 6.0* 5.7*  ALBUMIN 2.8* 2.5* 2.6* 2.4*   No results for input(s): LIPASE, AMYLASE in the last 168 hours. No results for input(s): AMMONIA in the last 168 hours. Coagulation Profile: No results for input(s): INR, PROTIME in the last 168 hours. Cardiac Enzymes: No results for input(s): CKTOTAL, CKMB, CKMBINDEX, TROPONINI in the last 168 hours. BNP (last 3 results) No results for input(s): PROBNP in the last 8760 hours. HbA1C: Recent Labs    04/04/20 0338  HGBA1C 6.3*   CBG: Recent Labs  Lab 04/04/20 1143 04/04/20 1634 04/04/20 2207 04/05/20 0906 04/05/20 1306  GLUCAP 181* 213* 156* 153* 279*   Lipid Profile: No results for input(s): CHOL, HDL, LDLCALC, TRIG, CHOLHDL, LDLDIRECT in the  last 72 hours. Thyroid Function Tests: No results for input(s): TSH, T4TOTAL, FREET4, T3FREE, THYROIDAB in the last 72 hours. Anemia Panel: Recent Labs    04/04/20 0338 04/05/20 0408  FERRITIN 1,456* 1,022*   Sepsis Labs: Recent Labs  Lab 04/09/2020 1912 03/28/2020 2120  PROCALCITON <0.10 <0.10    Recent Results (from the past 240 hour(s))  SARS Coronavirus 2 by RT PCR (hospital order, performed in [facility] hospital lab) Nasopharyngeal Nasopharyngeal Swab     Status: Abnormal   Collection Time: 04/06/2020  7:53 PM   Specimen: Nasopharyngeal Swab  Result Value Ref Range Status   SARS Coronavirus 2 POSITIVE (A) NEGATIVE Final  Comment: RESULT CALLED TO, READ BACK BY AND VERIFIED WITH: LEA FERGUISON 03/19/2020 AT 2115 BY AR (NOTE) SARS-CoV-2 target nucleic acids are DETECTED  SARS-CoV-2 RNA is generally detectable in upper respiratory specimens  during the acute phase of infection.  Positive results are indicative  of the presence of the identified virus, but do not rule out bacterial infection or co-infection with other pathogens not detected by the test.  Clinical correlation with patient history and  other diagnostic information is necessary to determine patient infection status.  The expected result is negative.  Fact Sheet for Patients:   BoilerBrush.com.cyhttps://www.fda.gov/media/136312/download   Fact Sheet for Healthcare Providers:   https://pope.com/https://www.fda.gov/media/136313/download    This test is not yet approved or cleared by the [place] FDA and  has been authorized for detection and/or diagnosis of SARS-CoV-2 by FDA under an Emergency Use Authorization (EUA).  This EUA will remain in effect (meaning this t est can be used) for the duration of  the COVID-19 declaration under Section 564(b)(1) of the Act, 21 U.S.C. section 360-bbb-3(b)(1), unless the authorization is terminated or revoked sooner.  Performed at [facility], [address] [place], [place] [address]    Culture, blood (Routine X 2) w Reflex to ID Panel     Status: None (Preliminary result)   Collection Time: 04/06/2020  9:20 PM   Specimen: BLOOD  Result Value Ref Range Status   Specimen Description BLOOD BLOOD RIGHT FOREARM  Final   Special Requests   Final    BOTTLES DRAWN AEROBIC AND ANAEROBIC Blood Culture adequate volume   Culture   Final    NO GROWTH 4 DAYS Performed at [facility], [address] [place], [place] [address]    Report Status PENDING  Incomplete  Culture, blood (Routine X 2) w Reflex to ID Panel     Status: None (Preliminary result)   Collection Time: 03/23/2020  9:31 PM   Specimen: BLOOD  Result Value Ref Range Status   Specimen Description BLOOD RIGHT ANTECUBITAL  Final   Special Requests   Final    BOTTLES DRAWN AEROBIC AND ANAEROBIC Blood Culture results may not be optimal due to an excessive volume of blood received in culture bottles   Culture   Final    NO GROWTH 4 DAYS Performed at [facility], [address] [place], [place] [address]    Report Status PENDING  Incomplete  MRSA PCR Screening     Status: None   Collection Time: 04/02/20 10:00 AM   Specimen: Nasal Mucosa; Nasopharyngeal  Result Value Ref Range Status   MRSA by PCR NEGATIVE NEGATIVE Final    Comment:        The GeneXpert MRSA Assay (FDA approved for NASAL specimens only), is one component of a comprehensive MRSA colonization surveillance program. It is not intended to diagnose MRSA infection nor to guide or monitor treatment for MRSA infections. Performed at [facility], [address] [place], [place] [address]          Radiology Studies: US Venous Img Lower Bilateral (DVT)  Result Date: 04/05/2020 CLINICAL DATA:  Bilateral lower extremity pain and edema. Elevated D-dimer. Patient is currently on anticoagulation. Evaluate for DVT. EXAM: BILATERAL LOWER EXTREMITY VENOUS DOPPLER ULTRASOUND TECHNIQUE: Gray-scale sonography with graded  compression, as well as color Doppler and duplex ultrasound were performed to evaluate the lower extremity deep venous systems from the level of the common femoral vein and including the common femoral, femoral, profunda femoral, popliteal  and calf veins including the posterior tibial, peroneal and gastrocnemius veins when visible. The superficial great saphenous vein was also interrogated. Spectral Doppler was utilized to evaluate flow at rest and with distal augmentation maneuvers in the common femoral, femoral and popliteal veins. COMPARISON:  None. FINDINGS: RIGHT LOWER EXTREMITY Common Femoral Vein: No evidence of thrombus. Normal compressibility, respiratory phasicity and response to augmentation. Saphenofemoral Junction: No evidence of thrombus. Normal compressibility and flow on color Doppler imaging. Profunda Femoral Vein: No evidence of thrombus. Normal compressibility and flow on color Doppler imaging. Femoral Vein: No evidence of thrombus. Normal compressibility, respiratory phasicity and response to augmentation. Popliteal Vein: No evidence of thrombus. Normal compressibility, respiratory phasicity and response to augmentation. Calf Veins: No evidence of thrombus. Normal compressibility and flow on color Doppler imaging. Superficial Great Saphenous Vein: No evidence of thrombus. Normal compressibility. Venous Reflux:  None. Other Findings:  None. LEFT LOWER EXTREMITY Common Femoral Vein: No evidence of thrombus. Normal compressibility, respiratory phasicity and response to augmentation. Saphenofemoral Junction: No evidence of thrombus. Normal compressibility and flow on color Doppler imaging. Profunda Femoral Vein: No evidence of thrombus. Normal compressibility and flow on color Doppler imaging. Femoral Vein: No evidence of thrombus. Normal compressibility, respiratory phasicity and response to augmentation. Popliteal Vein: No evidence of thrombus. Normal compressibility, respiratory phasicity and  response to augmentation. Calf Veins: No evidence of thrombus. Normal compressibility and flow on color Doppler imaging. Superficial Great Saphenous Vein: No evidence of thrombus. Normal compressibility. Venous Reflux:  None. Other Findings:  None. IMPRESSION: No evidence of DVT within either lower extremity. Electronically Signed   By: Simonne Come M.D.   On: 04/05/2020 07:34        Scheduled Meds: . apixaban  2.5 mg Oral BID  . vitamin C  500 mg Oral Daily  . aspirin EC  325 mg Oral Daily  . baricitinib  4 mg Oral Daily  . chlorhexidine  15 mL Mouth Rinse BID  . Chlorhexidine Gluconate Cloth  6 each Topical Daily  . cholecalciferol  1,000 Units Oral Daily  . diltiazem  180 mg Oral Q12H  . famotidine  20 mg Oral BID  . feeding supplement (ENSURE ENLIVE)  237 mL Oral TID BM  . guaiFENesin  600 mg Oral BID  . insulin aspart  0-5 Units Subcutaneous QHS  . insulin aspart  0-6 Units Subcutaneous TID WC  . mouth rinse  15 mL Mouth Rinse q12n4p  . methylPREDNISolone (SOLU-MEDROL) injection  80 mg Intravenous Q12H  . zinc sulfate  220 mg Oral Daily   Continuous Infusions:    LOS: 4 days    Time spent: 25 minutes    Tresa Moore, MD [facility] Pager [phone]  If 7PM-7AM, please contact night-coverage 04/05/2020, 3:51 PM

## 2020-04-06 LAB — FERRITIN: Ferritin: 1023 ng/mL — ABNORMAL HIGH (ref 24–336)

## 2020-04-06 LAB — CULTURE, BLOOD (ROUTINE X 2)
Culture: NO GROWTH
Culture: NO GROWTH
Special Requests: ADEQUATE

## 2020-04-06 LAB — GLUCOSE, CAPILLARY
Glucose-Capillary: 175 mg/dL — ABNORMAL HIGH (ref 70–99)
Glucose-Capillary: 154 mg/dL — ABNORMAL HIGH (ref 70–99)
Glucose-Capillary: 186 mg/dL — ABNORMAL HIGH (ref 70–99)
Glucose-Capillary: 245 mg/dL — ABNORMAL HIGH (ref 70–99)

## 2020-04-06 LAB — CBC WITH DIFFERENTIAL/PLATELET
Abs Immature Granulocytes: 0.14 10*3/uL — ABNORMAL HIGH (ref 0.00–0.07)
Basophils Absolute: 0 10*3/uL (ref 0.0–0.1)
Basophils Relative: 0 %
Eosinophils Absolute: 0 10*3/uL (ref 0.0–0.5)
Eosinophils Relative: 0 %
HCT: 39.4 % (ref 39.0–52.0)
Hemoglobin: 13.4 g/dL (ref 13.0–17.0)
Immature Granulocytes: 1 %
Lymphocytes Relative: 5 %
Lymphs Abs: 0.7 10*3/uL (ref 0.7–4.0)
MCH: 30.2 pg (ref 26.0–34.0)
MCHC: 34 g/dL (ref 30.0–36.0)
MCV: 88.9 fL (ref 80.0–100.0)
Monocytes Absolute: 0.4 10*3/uL (ref 0.1–1.0)
Monocytes Relative: 3 %
Neutro Abs: 13.1 10*3/uL — ABNORMAL HIGH (ref 1.7–7.7)
Neutrophils Relative %: 91 %
Platelets: 272 10*3/uL (ref 150–400)
RBC: 4.43 MIL/uL (ref 4.22–5.81)
RDW: 13.6 % (ref 11.5–15.5)
WBC: 14.4 10*3/uL — ABNORMAL HIGH (ref 4.0–10.5)
nRBC: 0 % (ref 0.0–0.2)

## 2020-04-06 LAB — MAGNESIUM: Magnesium: 2.4 mg/dL (ref 1.7–2.4)

## 2020-04-06 LAB — FIBRIN DERIVATIVES D-DIMER (ARMC ONLY): Fibrin derivatives D-dimer (ARMC): >7500 ng/mL (FEU) — ABNORMAL HIGH (ref 0.00–499.00)

## 2020-04-06 LAB — C-REACTIVE PROTEIN: CRP: 0.7 mg/dL (ref <1.0)

## 2020-04-06 MED ORDER — APIXABAN 5 MG PO TABS: 5.0000 mg | ORAL_TABLET | Freq: Two times a day (BID) | ORAL | Status: DC

## 2020-04-06 MED ORDER — METHYLPREDNISOLONE SODIUM SUCC 125 MG IJ SOLR
60.0000 mg | Freq: Two times a day (BID) | INTRAMUSCULAR | Status: DC
  Administered 2020-04-06 – 2020-04-07 (×2): 60 mg via INTRAVENOUS
  Filled 2020-04-06 (×2): qty 2

## 2020-04-06 MED ORDER — APIXABAN 5 MG PO TABS
5.0000 mg | ORAL_TABLET | Freq: Two times a day (BID) | ORAL | Status: DC
  Administered 2020-04-06 – 2020-04-14 (×15): 5 mg via ORAL
  Filled 2020-04-06 (×15): qty 1

## 2020-04-06 MED ORDER — APIXABAN 2.5 MG PO TABS
2.5000 mg | ORAL_TABLET | Freq: Once | ORAL | Status: AC
  Administered 2020-04-06: 2.5 mg via ORAL
  Filled 2020-04-06: qty 1

## 2020-04-06 MED ORDER — FUROSEMIDE 10 MG/ML IJ SOLN
40.0000 mg | Freq: Once | INTRAMUSCULAR | Status: AC
  Administered 2020-04-06: 40 mg via INTRAVENOUS
  Filled 2020-04-06: qty 4

## 2020-04-06 NOTE — Progress Notes (Signed)
PROGRESS NOTE    Johnny Shepard  CWC:[id number] DOB: 1951/04/14 DOA: 04-23-20 PCP: Patient, No Pcp Per   Brief Narrative:  69 year old man PMH atrial fibrillation presented with shortness of breath, recent outpatient diagnosis with Covid.  Admitted for acute hypoxic respiratory failure secondary to Covid multifocal pneumonia.  8/25: Patient seen and examined.  Lying prone in bed.  Still on 50 L high flow in addition nonrebreather.  Saturations in the 90s on above oxygen.  Mentating clearly.  Normal work of breathing.  8/26: Patient seen and examined.  Clinically appears better today.  Remains on 50 L high flow nasal cannula and nonrebreather.  Saturations mid 90s.  Mentating clearly.  Normal work of breathing.  8/27: Patient seen and examined.  Continues to subjectively improve on my evaluation.  Sitting up in bed.  Eating.  Speaking in complete sentences.  Remains on 50 L high flow nasal cannula in addition to nonrebreather.  Saturations been 90.  Mentating very clearly.  Normal work of breathing.   Assessment & Plan:   Principal Problem:   Acute hypoxemic respiratory failure due to COVID-19 [facility]) Active Problems:   Pneumonia due to COVID-19 virus   Unspecified atrial fibrillation (HCC)   Thrombocytopenia (HCC)   Elevated transaminase level  Acute hypoxemic respiratory failure due to COVID-19 [facility]) --appears about the same today, remains on HHF 50L, NRB w/ SpO2 in high 80s --8/23 CXR Minimal patchy pulmonary infiltrate may be present at the left lung base and within the right mid lung zone --oxygen HHF 50L, NRB --inflammatory markers  Ferritin 1605 > 1551 > 1558>1456>1023  CRP 5.9 > 4.8 > 2.5 >1.1>0.7  Fibrin derivatives 935 > 819 > 1437 >2608>7500  CTA could not be performed.  Lower extremity Doppler negative for PE --Tx  Remdesivir 8/22 >  Steroids 8/22 >   Baricitinib 8/23 >  Prone  Mobility as tolerated Attempt Lasix 40 mg IV x1 today Consultation now  requested with PCCM.  Recommendations appreciated.  Consideration for empirically placing on therapeutic anticoagulation given uptrending D-dimer.  This despite negative bilateral lower extremity duplex.  Could potentially pursue a perfusion study.  Unspecified atrial fibrillation (HCC) S/p Watchman procedure at [facility] --stable; continue apixaban, diltiazem  Pneumonia due to COVID-19 virus --plan as above  Thrombocytopenia (HCC) --secondary to acute illness, resolved.  Elevated transaminase level --AST slightly decreased. Will monitor w/ daily CMP   DVT prophylaxis: Eliquis Code Status: Full Family Communication: Wife via phone [phone] on 04/06/2020 Disposition Plan:Status is: Inpatient  Remains inpatient appropriate because:Inpatient level of care appropriate due to severity of illness   Dispo: The patient is from: Home              Anticipated d/c is to: Home              Anticipated d/c date is: > 3 days              Patient currently is not medically stable to d/c.   Still with severe acute hypoxic respiratory failure secondary to COVID-19 pneumonia.  Uptrending D-dimer.  Remains on 50 L high flow and nonrebreather.  Unable to wean.  Involved PCCM for recommendations today.     Consultants:   None  Procedures:   None  Antimicrobials:   Remdesivir   Subjective: Patient seen and examined.  Still on 50L heated high flow nasal cannula and 15 L nonrebreather.  Clinically appears better today.  Work of breathing decreased.  Mentating clearly  Objective: Vitals:  04/06/20 0100 04/06/20 0200 04/06/20 0315 04/06/20 0800  BP:  98/74  112/76  Pulse: 65 66  68  Resp: (!) 29 11  14   Temp:  97.7 F (36.5 C)  97.7 F (36.5 C)  TempSrc:  Axillary    SpO2: 98% 98% 98% 97%  Weight:      Height:        Intake/Output Summary (Last 24 hours) at 04/06/2020 1423 Last data filed at 04/05/2020 2123 Gross per 24 hour  Intake --  Output 650 ml  Net -650 ml    Filed Weights   2020/04/21 1852 04/02/20 0915  Weight: 79 kg 84.7 kg    Examination:  General: No apparent distress, patient appears well HEENT: Normocephalic, atraumatic Neck, supple, trachea midline, no tenderness Heart: Regular rate and rhythm, S1/S2 normal, no murmurs Lungs: Normal work of breathing.  Bibasilar crackles.  50 L high flow plus NRB abdomen: Soft, nontender, nondistended, positive bowel sounds Extremities: Normal, atraumatic, no clubbing or cyanosis, normal muscle tone Skin: No rashes or lesions, normal color Neurologic: Cranial nerves grossly intact, sensation intact, alert and oriented x3 Psychiatric: Normal affect     Data Reviewed: I have personally reviewed following labs and imaging studies  CBC: Recent Labs  Lab 04/02/20 0511 04/03/20 0422 04/04/20 0338 04/05/20 0408 04/06/20 0439  WBC 3.2* 6.4 8.9 10.9* 14.4*  NEUTROABS 2.5 5.3 7.7 9.7* 13.1*  HGB 12.1* 13.4 13.2 13.1 13.4  HCT 36.7* 39.4 38.7* 38.0* 39.4  MCV 90.8 88.1 88.6 88.2 88.9  PLT 142* 194 222 245 272   Basic Metabolic Panel: Recent Labs  Lab 04-21-20 1912 04/02/20 0511 04/03/20 0422 04/04/20 0338 04/05/20 0408 04/06/20 0439  NA 133*  --  136 133* 137  --   K 3.9  --  3.9 4.2 4.1  --   CL 97*  --  102 100 101  --   CO2 23  --  23 24 26   --   GLUCOSE 131*  --  141* 146* 170*  --   BUN 28*  --  24* 27* 27*  --   CREATININE 0.88  --  0.55* 0.71 0.68  --   CALCIUM 8.1*  --  7.9* 8.0* 7.9*  --   MG  --  2.0 2.1 2.3 2.2 2.4   GFR: Estimated Creatinine Clearance: 95.7 mL/min (by C-G formula based on SCr of 0.68 mg/dL). Liver Function Tests: Recent Labs  Lab April 21, 2020 1912 04/03/20 0422 04/04/20 0338 04/05/20 0408  AST 56* 47* 61* 50*  ALT 33 30 47* 58*  ALKPHOS 30* 31* 38 45  BILITOT 0.7 0.7 1.0 1.0  PROT 6.7 5.9* 6.0* 5.7*  ALBUMIN 2.8* 2.5* 2.6* 2.4*   No results for input(s): LIPASE, AMYLASE in the last 168 hours. No results for input(s): AMMONIA in the last 168  hours. Coagulation Profile: No results for input(s): INR, PROTIME in the last 168 hours. Cardiac Enzymes: No results for input(s): CKTOTAL, CKMB, CKMBINDEX, TROPONINI in the last 168 hours. BNP (last 3 results) No results for input(s): PROBNP in the last 8760 hours. HbA1C: Recent Labs    04/04/20 0338  HGBA1C 6.3*   CBG: Recent Labs  Lab 04/05/20 1306 04/05/20 1636 04/05/20 2115 04/06/20 0741 04/06/20 1120  GLUCAP 279* 196* 175* 175* 154*   Lipid Profile: No results for input(s): CHOL, HDL, LDLCALC, TRIG, CHOLHDL, LDLDIRECT in the last 72 hours. Thyroid Function Tests: No results for input(s): TSH, T4TOTAL, FREET4, T3FREE, THYROIDAB in the last 72 hours.  Anemia Panel: Recent Labs    04/05/20 0408 04/06/20 0439  FERRITIN 1,022* 1,023*   Sepsis Labs: Recent Labs  Lab 04/11/20 1912 2020/04/11 2120  PROCALCITON <0.10 <0.10    Recent Results (from the past 240 hour(s))  SARS Coronavirus 2 by RT PCR (hospital order, performed in [facility] hospital lab) Nasopharyngeal Nasopharyngeal Swab     Status: Abnormal   Collection Time: 04-11-2020  7:53 PM   Specimen: Nasopharyngeal Swab  Result Value Ref Range Status   SARS Coronavirus 2 POSITIVE (A) NEGATIVE Final    Comment: RESULT CALLED TO, READ BACK BY AND VERIFIED WITH: LEA FERGUISON Apr 11, 2020 AT 2115 BY AR (NOTE) SARS-CoV-2 target nucleic acids are DETECTED  SARS-CoV-2 RNA is generally detectable in upper respiratory specimens  during the acute phase of infection.  Positive results are indicative  of the presence of the identified virus, but do not rule out bacterial infection or co-infection with other pathogens not detected by the test.  Clinical correlation with patient history and  other diagnostic information is necessary to determine patient infection status.  The expected result is negative.  Fact Sheet for Patients:   BoilerBrush.com.cy   Fact Sheet for Healthcare Providers:     https://pope.com/    This test is not yet approved or cleared by the [place] FDA and  has been authorized for detection and/or diagnosis of SARS-CoV-2 by FDA under an Emergency Use Authorization (EUA).  This EUA will remain in effect (meaning this t est can be used) for the duration of  the COVID-19 declaration under Section 564(b)(1) of the Act, 21 U.S.C. section 360-bbb-3(b)(1), unless the authorization is terminated or revoked sooner.  Performed at [facility], [address] [place], [place] [address]   Culture, blood (Routine X 2) w Reflex to ID Panel     Status: None   Collection Time: April 11, 2020  9:20 PM   Specimen: BLOOD  Result Value Ref Range Status   Specimen Description BLOOD BLOOD RIGHT FOREARM  Final   Special Requests   Final    BOTTLES DRAWN AEROBIC AND ANAEROBIC Blood Culture adequate volume   Culture   Final    NO GROWTH 5 DAYS Performed at [facility], [address] [place], [place] [address]    Report Status 04/06/2020 FINAL  Final  Culture, blood (Routine X 2) w Reflex to ID Panel     Status: None   Collection Time: 2020-04-11  9:31 PM   Specimen: BLOOD  Result Value Ref Range Status   Specimen Description BLOOD RIGHT ANTECUBITAL  Final   Special Requests   Final    BOTTLES DRAWN AEROBIC AND ANAEROBIC Blood Culture results may not be optimal due to an excessive volume of blood received in culture bottles   Culture   Final    NO GROWTH 5 DAYS Performed at [facility], [address] [place], [place] [address]    Report Status 04/06/2020 FINAL  Final  MRSA PCR Screening     Status: None   Collection Time: 04/02/20 10:00 AM   Specimen: Nasal Mucosa; Nasopharyngeal  Result Value Ref Range Status   MRSA by PCR NEGATIVE NEGATIVE Final    Comment:        The GeneXpert MRSA Assay (FDA approved for NASAL specimens only), is one component of a comprehensive MRSA colonization surveillance  program. It is not intended to diagnose MRSA infection nor to guide or monitor treatment for MRSA infections. Performed at [facility],  [address] [place], [place] [id number]          Radiology Studies: US Venous Img Lower Bilateral (DVT)  Result Date: 04/05/2020 CLINICAL DATA:  Bilateral lower extremity pain and edema. Elevated D-dimer. Patient is currently on anticoagulation. Evaluate for DVT. EXAM: BILATERAL LOWER EXTREMITY VENOUS DOPPLER ULTRASOUND TECHNIQUE: Gray-scale sonography with graded compression, as well as color Doppler and duplex ultrasound were performed to evaluate the lower extremity deep venous systems from the level of the common femoral vein and including the common femoral, femoral, profunda femoral, popliteal and calf veins including the posterior tibial, peroneal and gastrocnemius veins when visible. The superficial great saphenous vein was also interrogated. Spectral Doppler was utilized to evaluate flow at rest and with distal augmentation maneuvers in the common femoral, femoral and popliteal veins. COMPARISON:  None. FINDINGS: RIGHT LOWER EXTREMITY Common Femoral Vein: No evidence of thrombus. Normal compressibility, respiratory phasicity and response to augmentation. Saphenofemoral Junction: No evidence of thrombus. Normal compressibility and flow on color Doppler imaging. Profunda Femoral Vein: No evidence of thrombus. Normal compressibility and flow on color Doppler imaging. Femoral Vein: No evidence of thrombus. Normal compressibility, respiratory phasicity and response to augmentation. Popliteal Vein: No evidence of thrombus. Normal compressibility, respiratory phasicity and response to augmentation. Calf Veins: No evidence of thrombus. Normal compressibility and flow on color Doppler imaging. Superficial Great Saphenous Vein: No evidence of thrombus. Normal compressibility. Venous Reflux:  None. Other Findings:  None. LEFT LOWER EXTREMITY Common  Femoral Vein: No evidence of thrombus. Normal compressibility, respiratory phasicity and response to augmentation. Saphenofemoral Junction: No evidence of thrombus. Normal compressibility and flow on color Doppler imaging. Profunda Femoral Vein: No evidence of thrombus. Normal compressibility and flow on color Doppler imaging. Femoral Vein: No evidence of thrombus. Normal compressibility, respiratory phasicity and response to augmentation. Popliteal Vein: No evidence of thrombus. Normal compressibility, respiratory phasicity and response to augmentation. Calf Veins: No evidence of thrombus. Normal compressibility and flow on color Doppler imaging. Superficial Great Saphenous Vein: No evidence of thrombus. Normal compressibility. Venous Reflux:  None. Other Findings:  None. IMPRESSION: No evidence of DVT within either lower extremity. Electronically Signed   By: Simonne ComeJohn  Watts M.D.   On: 04/05/2020 07:34        Scheduled Meds: . apixaban  5 mg Oral BID  . vitamin C  500 mg Oral Daily  . aspirin EC  325 mg Oral Daily  . baricitinib  4 mg Oral Daily  . chlorhexidine  15 mL Mouth Rinse BID  . Chlorhexidine Gluconate Cloth  6 each Topical Daily  . cholecalciferol  1,000 Units Oral Daily  . diltiazem  180 mg Oral Q12H  . famotidine  20 mg Oral BID  . feeding supplement (ENSURE ENLIVE)  237 mL Oral TID BM  . guaiFENesin  600 mg Oral BID  . insulin aspart  0-5 Units Subcutaneous QHS  . insulin aspart  0-6 Units Subcutaneous TID WC  . mouth rinse  15 mL Mouth Rinse q12n4p  . methylPREDNISolone (SOLU-MEDROL) injection  60 mg Intravenous Q12H  . zinc sulfate  220 mg Oral Daily   Continuous Infusions:    LOS: 5 days    Time spent: 25 minutes    Tresa MooreSudheer B William Laske, MD [facility] Pager [phone]  If 7PM-7AM, please contact night-coverage 04/06/2020, 2:23 PM

## 2020-04-06 NOTE — Progress Notes (Signed)
Patient alert and oriented. No complaints of shortness of breath. Tylenol given for back pain. NON re-breather taken off at lunch and patient oxygenating 90-97% throughout the day. Tolerating diet and using urinal. Continue to assess.

## 2020-04-06 NOTE — Consult Note (Signed)
CRITICAL CARE PROGRESS NOTE    Name: Johnny Shepard MRN: [id number] DOB: 1951/03/07     LOS: 5   SUBJECTIVE FINDINGS & SIGNIFICANT EVENTS    Patient description:  69 year old man PMH atrial fibrillation presented with shortness of breath, recent outpatient diagnosis with Covid. Admitted for acute hypoxic respiratory failure secondary to Covid multifocal pneumonia.   Lines/tubes :   Microbiology/Sepsis markers: Results for orders placed or performed during the hospital encounter of 04/03/2020  SARS Coronavirus 2 by RT PCR (hospital order, performed in [facility] hospital lab) Nasopharyngeal Nasopharyngeal Swab     Status: Abnormal   Collection Time: 03/24/2020  7:53 PM   Specimen: Nasopharyngeal Swab  Result Value Ref Range Status   SARS Coronavirus 2 POSITIVE (A) NEGATIVE Final    Comment: RESULT CALLED TO, READ BACK BY AND VERIFIED WITH: LEA FERGUISON 03/23/2020 AT 2115 BY AR (NOTE) SARS-CoV-2 target nucleic acids are DETECTED  SARS-CoV-2 RNA is generally detectable in upper respiratory specimens  during the acute phase of infection.  Positive results are indicative  of the presence of the identified virus, but do not rule out bacterial infection or co-infection with other pathogens not detected by the test.  Clinical correlation with patient history and  other diagnostic information is necessary to determine patient infection status.  The expected result is negative.  Fact Sheet for Patients:   BoilerBrush.com.cy   Fact Sheet for Healthcare Providers:   https://pope.com/    This test is not yet approved or cleared by the [place] FDA and  has been authorized for detection and/or diagnosis of SARS-CoV-2 by FDA under an Emergency Use Authorization  (EUA).  This EUA will remain in effect (meaning this t est can be used) for the duration of  the COVID-19 declaration under Section 564(b)(1) of the Act, 21 U.S.C. section 360-bbb-3(b)(1), unless the authorization is terminated or revoked sooner.  Performed at [facility], [address] [place], [place] [address]   Culture, blood (Routine X 2) w Reflex to ID Panel     Status: None   Collection Time: 04/06/2020  9:20 PM   Specimen: BLOOD  Result Value Ref Range Status   Specimen Description BLOOD BLOOD RIGHT FOREARM  Final   Special Requests   Final    BOTTLES DRAWN AEROBIC AND ANAEROBIC Blood Culture adequate volume   Culture   Final    NO GROWTH 5 DAYS Performed at [facility], [address] [place], [place] [address]    Report Status 04/06/2020 FINAL  Final  Culture, blood (Routine X 2) w Reflex to ID Panel     Status: None   Collection Time: 03/13/2020  9:31 PM   Specimen: BLOOD  Result Value Ref Range Status   Specimen Description BLOOD RIGHT ANTECUBITAL  Final   Special Requests   Final    BOTTLES DRAWN AEROBIC AND ANAEROBIC Blood Culture results may not be optimal due to an excessive volume of blood received in culture bottles   Culture   Final    NO GROWTH 5 DAYS Performed at [facility], [address] [place], [place] [address]    Report Status 04/06/2020 FINAL  Final  MRSA PCR Screening     Status: None   Collection Time: 04/02/20 10:00 AM   Specimen: Nasal Mucosa; Nasopharyngeal  Result Value Ref Range Status   MRSA by PCR NEGATIVE NEGATIVE Final    Comment:        The GeneXpert MRSA Assay (  FDA approved for NASAL specimens only), is one component of a comprehensive MRSA colonization surveillance program. It is not intended to diagnose MRSA infection nor to guide or monitor treatment for MRSA infections. Performed at [facility], [address] [place], [place] [address]     Anti-infectives:  Anti-infectives  (From admission, onward)   Start     Dose/Rate Route Frequency Ordered Stop   04/02/20 1000  remdesivir 100 mg in sodium chloride 0.9 % 100 mL IVPB       "Followed by" Linked Group Details   100 mg 200 mL/hr over 30 Minutes Intravenous Daily 03/19/2020 2025 04/05/20 1947   03/29/2020 2200  remdesivir 200 mg in sodium chloride 0.9% 250 mL IVPB       "Followed by" Linked Group Details   200 mg 580 mL/hr over 30 Minutes Intravenous Once 03/15/2020 2025 04/02/20 0015         PAST MEDICAL HISTORY   Past Medical History:  Diagnosis Date  . A-fib [facility])      SURGICAL HISTORY   Past Surgical History:  Procedure Laterality Date  . HERNIA REPAIR    . KNEE ARTHROSCOPY WITH MEDIAL MENISECTOMY Left 09/05/2015   Procedure: LEFT KNEE ARTHROSCOPY WITH PARTIAL MEDIAL MENISCECTOMY and synovectomy;  Surgeon: Tarry Kos, MD;  Location: [facility];  Service: Orthopedics;  Laterality: Left;     FAMILY HISTORY   History reviewed. No pertinent family history.   SOCIAL HISTORY   Social History   Tobacco Use  . Smoking status: Never Smoker  . Smokeless tobacco: Never Used  Substance Use Topics  . Alcohol use: No  . Drug use: No     MEDICATIONS   Current Medication:  Current Facility-Administered Medications:  .  acetaminophen (TYLENOL) tablet 650 mg, 650 mg, Oral, Q6H PRN, Mansy, Jan A, MD, 650 mg at 04/06/20 0112 .  apixaban (ELIQUIS) tablet 2.5 mg, 2.5 mg, Oral, BID, Standley Brooking, MD, 2.5 mg at 04/05/20 2118 .  ascorbic acid (VITAMIN C) tablet 500 mg, 500 mg, Oral, Daily, Mansy, Jan A, MD, 500 mg at 04/05/20 0908 .  aspirin EC tablet 325 mg, 325 mg, Oral, Daily, Mansy, Jan A, MD, 325 mg at 04/05/20 0908 .  baricitinib (OLUMIANT) tablet 4 mg, 4 mg, Oral, Daily, Standley Brooking, MD, 4 mg at 04/05/20 8469 .  chlorhexidine (PERIDEX) 0.12 % solution 15 mL, 15 mL, Mouth Rinse, BID, Standley Brooking, MD, 15 mL at 04/05/20 2119 .  Chlorhexidine Gluconate Cloth 2 %  PADS 6 each, 6 each, Topical, Daily, Standley Brooking, MD, 6 each at 04/05/20 [phone] .  chlorpheniramine-HYDROcodone (TUSSIONEX) 10-8 MG/5ML suspension 5 mL, 5 mL, Oral, Q12H PRN, Mansy, Jan A, MD .  cholecalciferol (VITAMIN D3) tablet 1,000 Units, 1,000 Units, Oral, Daily, Mansy, Vernetta Honey, MD, 1,000 Units at 04/05/20 0908 .  diltiazem (CARDIZEM CD) 24 hr capsule 180 mg, 180 mg, Oral, Q12H, Standley Brooking, MD, 180 mg at 04/05/20 2117 .  famotidine (PEPCID) tablet 20 mg, 20 mg, Oral, BID, Mansy, Jan A, MD, 20 mg at 04/05/20 2117 .  feeding supplement (ENSURE ENLIVE) (ENSURE ENLIVE) liquid 237 mL, 237 mL, Oral, TID BM, Standley Brooking, MD, 237 mL at 04/05/20 1915 .  guaiFENesin (MUCINEX) 12 hr tablet 600 mg, 600 mg, Oral, BID, Mansy, Jan A, MD, 600 mg at 04/05/20 2118 .  guaiFENesin-dextromethorphan (ROBITUSSIN DM) 100-10 MG/5ML syrup 10 mL, 10 mL, Oral, Q4H PRN, Mansy, Vernetta Honey, MD, 10  mL at 04/06/20 0111 .  insulin aspart (novoLOG) injection 0-5 Units, 0-5 Units, Subcutaneous, QHS, Standley BrookingGoodrich, Daniel P, MD .  insulin aspart (novoLOG) injection 0-6 Units, 0-6 Units, Subcutaneous, TID WC, Standley BrookingGoodrich, Daniel P, MD, 1 Units at 04/05/20 1637 .  magnesium hydroxide (MILK OF MAGNESIA) suspension 30 mL, 30 mL, Oral, Daily PRN, Mansy, Jan A, MD .  MEDLINE mouth rinse, 15 mL, Mouth Rinse, q12n4p, Standley BrookingGoodrich, Daniel P, MD, 15 mL at 04/05/20 1638 .  methylPREDNISolone sodium succinate (SOLU-MEDROL) 125 mg/2 mL injection 80 mg, 80 mg, Intravenous, Q12H, Standley BrookingGoodrich, Daniel P, MD, 80 mg at 04/05/20 2120 .  ondansetron (ZOFRAN) tablet 4 mg, 4 mg, Oral, Q6H PRN **OR** ondansetron (ZOFRAN) injection 4 mg, 4 mg, Intravenous, Q6H PRN, Mansy, Jan A, MD .  traZODone (DESYREL) tablet 25 mg, 25 mg, Oral, QHS PRN, Mansy, Jan A, MD .  zinc sulfate capsule 220 mg, 220 mg, Oral, Daily, Mansy, Jan A, MD, 220 mg at 04/05/20 0908    ALLERGIES   Bee venom and Iodine    REVIEW OF SYSTEMS   10 point ROS done  negative   PHYSICAL EXAMINATION   Vital Signs: Temp:  [97.3 F (36.3 C)-97.7 F (36.5 C)] 97.7 F (36.5 C) (08/27 0200) Pulse Rate:  [57-98] 66 (08/27 0200) Resp:  [11-29] 11 (08/27 0200) BP: (98-150)/(72-91) 98/74 (08/27 0200) SpO2:  [91 %-99 %] 98 % (08/27 0315) FiO2 (%):  [100 %] 100 % (08/27 0315)  GENERAL:NAD age appropirate speaking in full sentences on 100%Fio2 HEAD: Normocephalic, atraumatic.  EYES: Pupils equal, round, reactive to light.  No scleral icterus.  MOUTH: Moist mucosal membrane. NECK: Supple. No thyromegaly. No nodules. No JVD.  PULMONARY: rhonchi b/l CARDIOVASCULAR: S1 and S2. Regular rate and rhythm. No murmurs, rubs, or gallops.  GASTROINTESTINAL: Soft, nontender, non-distended. No masses. Positive bowel sounds. No hepatosplenomegaly.  MUSCULOSKELETAL: No swelling, clubbing, or edema.  NEUROLOGIC: grossly intact SKIN:intact,warm,dry   PERTINENT DATA     Infusions:  Scheduled Medications: . apixaban  2.5 mg Oral BID  . vitamin C  500 mg Oral Daily  . aspirin EC  325 mg Oral Daily  . baricitinib  4 mg Oral Daily  . chlorhexidine  15 mL Mouth Rinse BID  . Chlorhexidine Gluconate Cloth  6 each Topical Daily  . cholecalciferol  1,000 Units Oral Daily  . diltiazem  180 mg Oral Q12H  . famotidine  20 mg Oral BID  . feeding supplement (ENSURE ENLIVE)  237 mL Oral TID BM  . guaiFENesin  600 mg Oral BID  . insulin aspart  0-5 Units Subcutaneous QHS  . insulin aspart  0-6 Units Subcutaneous TID WC  . mouth rinse  15 mL Mouth Rinse q12n4p  . methylPREDNISolone (SOLU-MEDROL) injection  80 mg Intravenous Q12H  . zinc sulfate  220 mg Oral Daily   PRN Medications: acetaminophen, chlorpheniramine-HYDROcodone, guaiFENesin-dextromethorphan, magnesium hydroxide, ondansetron **OR** ondansetron (ZOFRAN) IV, traZODone Hemodynamic parameters:   Intake/Output: 08/26 0701 - 08/27 0700 In: -  Out: 2525 [Urine:2525]  Ventilator  Settings: FiO2 (%):  [100 %]  100 %   LAB RESULTS:  Basic Metabolic Panel: Recent Labs  Lab 06-02-2020 1912 06-02-2020 1912 04/02/20 0511 04/03/20 0422 04/03/20 0422 04/04/20 0338 04/05/20 0408 04/06/20 0439  NA 133*  --   --  136  --  133* 137  --   K 3.9   < >  --  3.9   < > 4.2 4.1  --   CL 97*  --   --  102  --  100 101  --   CO2 23  --   --  23  --  24 26  --   GLUCOSE 131*  --   --  141*  --  146* 170*  --   BUN 28*  --   --  24*  --  27* 27*  --   CREATININE 0.88  --   --  0.55*  --  0.71 0.68  --   CALCIUM 8.1*  --   --  7.9*  --  8.0* 7.9*  --   MG  --   --  2.0 2.1  --  2.3 2.2 2.4   < > = values in this interval not displayed.   Liver Function Tests: Recent Labs  Lab 04/10/2020 1912 04/03/20 0422 04/04/20 0338 04/05/20 0408  AST 56* 47* 61* 50*  ALT 33 30 47* 58*  ALKPHOS 30* 31* 38 45  BILITOT 0.7 0.7 1.0 1.0  PROT 6.7 5.9* 6.0* 5.7*  ALBUMIN 2.8* 2.5* 2.6* 2.4*   No results for input(s): LIPASE, AMYLASE in the last 168 hours. No results for input(s): AMMONIA in the last 168 hours. CBC: Recent Labs  Lab 04/02/20 0511 04/03/20 0422 04/04/20 0338 04/05/20 0408 04/06/20 0439  WBC 3.2* 6.4 8.9 10.9* 14.4*  NEUTROABS 2.5 5.3 7.7 9.7* 13.1*  HGB 12.1* 13.4 13.2 13.1 13.4  HCT 36.7* 39.4 38.7* 38.0* 39.4  MCV 90.8 88.1 88.6 88.2 88.9  PLT 142* 194 222 245 272   Cardiac Enzymes: No results for input(s): CKTOTAL, CKMB, CKMBINDEX, TROPONINI in the last 168 hours. BNP: Invalid input(s): POCBNP CBG: Recent Labs  Lab 04/05/20 0906 04/05/20 1306 04/05/20 1636 04/05/20 2115 04/06/20 0741  GLUCAP 153* 279* 196* 175* 175*       IMAGING RESULTS:  Imaging: US Venous Img Lower Bilateral (DVT)  Result Date: 04/05/2020 CLINICAL DATA:  Bilateral lower extremity pain and edema. Elevated D-dimer. Patient is currently on anticoagulation. Evaluate for DVT. EXAM: BILATERAL LOWER EXTREMITY VENOUS DOPPLER ULTRASOUND TECHNIQUE: Gray-scale sonography with graded compression, as well as color  Doppler and duplex ultrasound were performed to evaluate the lower extremity deep venous systems from the level of the common femoral vein and including the common femoral, femoral, profunda femoral, popliteal and calf veins including the posterior tibial, peroneal and gastrocnemius veins when visible. The superficial great saphenous vein was also interrogated. Spectral Doppler was utilized to evaluate flow at rest and with distal augmentation maneuvers in the common femoral, femoral and popliteal veins. COMPARISON:  None. FINDINGS: RIGHT LOWER EXTREMITY Common Femoral Vein: No evidence of thrombus. Normal compressibility, respiratory phasicity and response to augmentation. Saphenofemoral Junction: No evidence of thrombus. Normal compressibility and flow on color Doppler imaging. Profunda Femoral Vein: No evidence of thrombus. Normal compressibility and flow on color Doppler imaging. Femoral Vein: No evidence of thrombus. Normal compressibility, respiratory phasicity and response to augmentation. Popliteal Vein: No evidence of thrombus. Normal compressibility, respiratory phasicity and response to augmentation. Calf Veins: No evidence of thrombus. Normal compressibility and flow on color Doppler imaging. Superficial Great Saphenous Vein: No evidence of thrombus. Normal compressibility. Venous Reflux:  None. Other Findings:  None. LEFT LOWER EXTREMITY Common Femoral Vein: No evidence of thrombus. Normal compressibility, respiratory phasicity and response to augmentation. Saphenofemoral Junction: No evidence of thrombus. Normal compressibility and flow on color Doppler imaging. Profunda Femoral Vein: No evidence of thrombus. Normal compressibility and flow on color Doppler imaging. Femoral Vein: No evidence of thrombus. Normal compressibility, respiratory  phasicity and response to augmentation. Popliteal Vein: No evidence of thrombus. Normal compressibility, respiratory phasicity and response to augmentation. Calf  Veins: No evidence of thrombus. Normal compressibility and flow on color Doppler imaging. Superficial Great Saphenous Vein: No evidence of thrombus. Normal compressibility. Venous Reflux:  None. Other Findings:  None. IMPRESSION: No evidence of DVT within either lower extremity. Electronically Signed   By: Simonne Come M.D.   On: 04/05/2020 07:34     ASSESSMENT AND PLAN    -Multidisciplinary rounds held today  Acute Hypoxic Respiratory Failure Acute COVID19 pneumonia -Remdesevir antiviral - pharmacy protocol 5 d -vitamin C -zinc -decadron 6mg  IV daily  -Diuresis - Lasix 40 IV daily - monitor UOP - utilize external urinary catheter if possible -Self prone if patient can tolerate  -encourage to use IS and Acapella device for bronchopulmonary hygiene when able -d/c hepatotoxic medications while on remdesevir -supportive care with ICU telemetry monitoring -PT/OT when possible -procalcitonin, CRP and ferritin trending    Atrial fibrillation  -on eliquis and cardizem -rate controlled    ID -continue IV abx as prescibed -follow up cultures  GI/Nutrition GI PROPHYLAXIS as indicated DIET-->TF's as tolerated Constipation protocol as indicated  ENDO - ICU hypoglycemic\Hyperglycemia protocol -check FSBS per protocol   ELECTROLYTES -follow labs as needed -replace as needed -pharmacy consultation   DVT/GI PRX ordered -SCDs  TRANSFUSIONS AS NEEDED MONITOR FSBS ASSESS the need for LABS as needed   Critical care provider statement:    Critical care time (minutes):  33   Critical care time was exclusive of:  Separately billable procedures and treating other patients   Critical care was necessary to treat or prevent imminent or life-threatening deterioration of the following conditions:  acute hypoxemic respiratory failure due to COVID19   Critical care was time spent personally by me on the following activities:  Development of treatment plan with patient or surrogate,  discussions with consultants, evaluation of patient's response to treatment, examination of patient, obtaining history from patient or surrogate, ordering and performing treatments and interventions, ordering and review of laboratory studies and re-evaluation of patient's condition.  I assumed direction of critical care for this patient from another provider in my specialty: no    This document was prepared using Dragon voice recognition software and may include unintentional dictation errors.    , M.D.  Division of Pulmonary & Critical Care Medicine  [facility]

## 2020-04-07 LAB — CBC WITH DIFFERENTIAL/PLATELET
Abs Immature Granulocytes: 0.11 10*3/uL — ABNORMAL HIGH (ref 0.00–0.07)
Basophils Absolute: 0 10*3/uL (ref 0.0–0.1)
Basophils Relative: 0 %
Eosinophils Absolute: 0 10*3/uL (ref 0.0–0.5)
Eosinophils Relative: 0 %
HCT: 40.1 % (ref 39.0–52.0)
Hemoglobin: 13.8 g/dL (ref 13.0–17.0)
Immature Granulocytes: 1 %
Lymphocytes Relative: 2 %
Lymphs Abs: 0.4 10*3/uL — ABNORMAL LOW (ref 0.7–4.0)
MCH: 29.7 pg (ref 26.0–34.0)
MCHC: 34.4 g/dL (ref 30.0–36.0)
MCV: 86.4 fL (ref 80.0–100.0)
Monocytes Absolute: 0.4 10*3/uL (ref 0.1–1.0)
Monocytes Relative: 3 %
Neutro Abs: 16.4 10*3/uL — ABNORMAL HIGH (ref 1.7–7.7)
Neutrophils Relative %: 94 %
Platelets: 271 10*3/uL (ref 150–400)
RBC: 4.64 MIL/uL (ref 4.22–5.81)
RDW: 13.7 % (ref 11.5–15.5)
WBC: 17.4 10*3/uL — ABNORMAL HIGH (ref 4.0–10.5)
nRBC: 0 % (ref 0.0–0.2)

## 2020-04-07 LAB — COMPREHENSIVE METABOLIC PANEL
ALT: 48 U/L — ABNORMAL HIGH (ref 0–44)
AST: 33 U/L (ref 15–41)
Albumin: 2.4 g/dL — ABNORMAL LOW (ref 3.5–5.0)
Alkaline Phosphatase: 49 U/L (ref 38–126)
Anion gap: 11 (ref 5–15)
BUN: 37 mg/dL — ABNORMAL HIGH (ref 8–23)
CO2: 32 mmol/L (ref 22–32)
Calcium: 8.1 mg/dL — ABNORMAL LOW (ref 8.9–10.3)
Chloride: 92 mmol/L — ABNORMAL LOW (ref 98–111)
Creatinine, Ser: 0.81 mg/dL (ref 0.61–1.24)
GFR calc Af Amer: >60 mL/min (ref >60)
GFR calc non Af Amer: >60 mL/min (ref >60)
Glucose, Bld: 240 mg/dL — ABNORMAL HIGH (ref 70–99)
Potassium: 4.4 mmol/L (ref 3.5–5.1)
Sodium: 135 mmol/L (ref 135–145)
Total Bilirubin: 1 mg/dL (ref 0.3–1.2)
Total Protein: 5.9 g/dL — ABNORMAL LOW (ref 6.5–8.1)

## 2020-04-07 LAB — GLUCOSE, CAPILLARY
Glucose-Capillary: 228 mg/dL — ABNORMAL HIGH (ref 70–99)
Glucose-Capillary: 229 mg/dL — ABNORMAL HIGH (ref 70–99)
Glucose-Capillary: 211 mg/dL — ABNORMAL HIGH (ref 70–99)
Glucose-Capillary: 345 mg/dL — ABNORMAL HIGH (ref 70–99)

## 2020-04-07 MED ORDER — MENTHOL 3 MG MT LOZG
1.0000 | LOZENGE | OROMUCOSAL | Status: DC | PRN
  Filled 2020-04-07: qty 9

## 2020-04-07 MED ORDER — METHYLPREDNISOLONE SODIUM SUCC 40 MG IJ SOLR
40.0000 mg | Freq: Two times a day (BID) | INTRAMUSCULAR | Status: DC
  Administered 2020-04-07 – 2020-04-15 (×16): 40 mg via INTRAVENOUS
  Filled 2020-04-07 (×16): qty 1

## 2020-04-07 NOTE — Progress Notes (Signed)
Patient alert and oriented.  States he is 'feeling much better' today.  Able to tolerate HFNC while eating and talking on phone. Dyspnea with movement in bed. Patient proned in bed for approx 4 hours.  Tolerated well. No acute distress noted.  Will continue to monitor.

## 2020-04-07 NOTE — Progress Notes (Signed)
CRITICAL CARE PROGRESS NOTE    Name: Johnny Shepard MRN: [id number] DOB: May 14, 1951     LOS: 6   SUBJECTIVE FINDINGS & SIGNIFICANT EVENTS    Patient description:  69 year old man PMH atrial fibrillation presented with shortness of breath, recent outpatient diagnosis with Covid. Admitted for acute hypoxic respiratory failure secondary to Covid multifocal pneumonia.   04/07/20- no overnight events patient verbalizes in full sentences, relates clinical improvement.   Lines/tubes :   Microbiology/Sepsis markers: Results for orders placed or performed during the hospital encounter of Apr 14, 2020  SARS Coronavirus 2 by RT PCR (hospital order, performed in [facility] hospital lab) Nasopharyngeal Nasopharyngeal Swab     Status: Abnormal   Collection Time: 04-14-2020  7:53 PM   Specimen: Nasopharyngeal Swab  Result Value Ref Range Status   SARS Coronavirus 2 POSITIVE (A) NEGATIVE Final    Comment: RESULT CALLED TO, READ BACK BY AND VERIFIED WITH: LEA FERGUISON 2020/04/14 AT 2115 BY AR (NOTE) SARS-CoV-2 target nucleic acids are DETECTED  SARS-CoV-2 RNA is generally detectable in upper respiratory specimens  during the acute phase of infection.  Positive results are indicative  of the presence of the identified virus, but do not rule out bacterial infection or co-infection with other pathogens not detected by the test.  Clinical correlation with patient history and  other diagnostic information is necessary to determine patient infection status.  The expected result is negative.  Fact Sheet for Patients:   BoilerBrush.com.cy   Fact Sheet for Healthcare Providers:   https://pope.com/    This test is not yet approved or cleared by the [place] FDA and  has been  authorized for detection and/or diagnosis of SARS-CoV-2 by FDA under an Emergency Use Authorization (EUA).  This EUA will remain in effect (meaning this t est can be used) for the duration of  the COVID-19 declaration under Section 564(b)(1) of the Act, 21 U.S.C. section 360-bbb-3(b)(1), unless the authorization is terminated or revoked sooner.  Performed at [facility], [address] [place], [place] [address]   Culture, blood (Routine X 2) w Reflex to ID Panel     Status: None   Collection Time: 04-14-20  9:20 PM   Specimen: BLOOD  Result Value Ref Range Status   Specimen Description BLOOD BLOOD RIGHT FOREARM  Final   Special Requests   Final    BOTTLES DRAWN AEROBIC AND ANAEROBIC Blood Culture adequate volume   Culture   Final    NO GROWTH 5 DAYS Performed at [facility], [address] [place], [place] [address]    Report Status 04/06/2020 FINAL  Final  Culture, blood (Routine X 2) w Reflex to ID Panel     Status: None   Collection Time: 14-Apr-2020  9:31 PM   Specimen: BLOOD  Result Value Ref Range Status   Specimen Description BLOOD RIGHT ANTECUBITAL  Final   Special Requests   Final    BOTTLES DRAWN AEROBIC AND ANAEROBIC Blood Culture results may not be optimal due to an excessive volume of blood received in culture bottles   Culture   Final    NO GROWTH 5 DAYS Performed at [facility], [address] [place], [place] [address]    Report Status 04/06/2020 FINAL  Final  MRSA PCR Screening     Status: None   Collection Time: 04/02/20 10:00 AM   Specimen: Nasal Mucosa; Nasopharyngeal  Result Value Ref Range Status   MRSA by PCR NEGATIVE NEGATIVE Final  Comment:        The GeneXpert MRSA Assay (FDA approved for NASAL specimens only), is one component of a comprehensive MRSA colonization surveillance program. It is not intended to diagnose MRSA infection nor to guide or monitor treatment for MRSA infections. Performed at [facility], [address] [place], [place] [address]     Anti-infectives:  Anti-infectives (From admission, onward)   Start     Dose/Rate Route Frequency Ordered Stop   04/02/20 1000  remdesivir 100 mg in sodium chloride 0.9 % 100 mL IVPB       "Followed by" Linked Group Details   100 mg 200 mL/hr over 30 Minutes Intravenous Daily Apr 07, 2020 2025 04/05/20 1947   Apr 07, 2020 2200  remdesivir 200 mg in sodium chloride 0.9% 250 mL IVPB       "Followed by" Linked Group Details   200 mg 580 mL/hr over 30 Minutes Intravenous Once 07-Apr-2020 2025 04/02/20 0015         PAST MEDICAL HISTORY   Past Medical History:  Diagnosis Date  . A-fib [facility])      SURGICAL HISTORY   Past Surgical History:  Procedure Laterality Date  . HERNIA REPAIR    . KNEE ARTHROSCOPY WITH MEDIAL MENISECTOMY Left 09/05/2015   Procedure: LEFT KNEE ARTHROSCOPY WITH PARTIAL MEDIAL MENISCECTOMY and synovectomy;  Surgeon: Tarry Kos, MD;  Location: [facility];  Service: Orthopedics;  Laterality: Left;     FAMILY HISTORY   History reviewed. No pertinent family history.   SOCIAL HISTORY   Social History   Tobacco Use  . Smoking status: Never Smoker  . Smokeless tobacco: Never Used  Substance Use Topics  . Alcohol use: No  . Drug use: No     MEDICATIONS   Current Medication:  Current Facility-Administered Medications:  .  acetaminophen (TYLENOL) tablet 650 mg, 650 mg, Oral, Q6H PRN, Mansy, Jan A, MD, 650 mg at 04/06/20 1740 .  apixaban (ELIQUIS) tablet 5 mg, 5 mg, Oral, BID, Sreenath, Sudheer B, MD, 5 mg at 04/07/20 1048 .  ascorbic acid (VITAMIN C) tablet 500 mg, 500 mg, Oral, Daily, Mansy, Jan A, MD, 500 mg at 04/07/20 1048 .  aspirin EC tablet 325 mg, 325 mg, Oral, Daily, Mansy, Jan A, MD, 325 mg at 04/07/20 1047 .  baricitinib (OLUMIANT) tablet 4 mg, 4 mg, Oral, Daily, Standley Brooking, MD, 4 mg at 04/07/20 1047 .  chlorhexidine (PERIDEX) 0.12 % solution 15 mL, 15 mL, Mouth  Rinse, BID, Standley Brooking, MD, 15 mL at 04/07/20 1054 .  Chlorhexidine Gluconate Cloth 2 % PADS 6 each, 6 each, Topical, Daily, Standley Brooking, MD, 6 each at 04/07/20 1054 .  chlorpheniramine-HYDROcodone (TUSSIONEX) 10-8 MG/5ML suspension 5 mL, 5 mL, Oral, Q12H PRN, Mansy, Jan A, MD .  cholecalciferol (VITAMIN D3) tablet 1,000 Units, 1,000 Units, Oral, Daily, Mansy, Vernetta Honey, MD, 1,000 Units at 04/07/20 1047 .  diltiazem (CARDIZEM CD) 24 hr capsule 180 mg, 180 mg, Oral, Q12H, Standley Brooking, MD, 180 mg at 04/07/20 1052 .  famotidine (PEPCID) tablet 20 mg, 20 mg, Oral, BID, Mansy, Jan A, MD, 20 mg at 04/07/20 1048 .  feeding supplement (ENSURE ENLIVE) (ENSURE ENLIVE) liquid 237 mL, 237 mL, Oral, TID BM, Standley Brooking, MD, 237 mL at 04/07/20 1044 .  guaiFENesin (MUCINEX) 12 hr tablet 600 mg, 600 mg, Oral, BID, Mansy, Jan A, MD, 600 mg at 04/07/20 1046 .  guaiFENesin-dextromethorphan (ROBITUSSIN DM) 100-10 MG/5ML syrup  10 mL, 10 mL, Oral, Q4H PRN, Mansy, Jan A, MD, 10 mL at 04/06/20 0111 .  insulin aspart (novoLOG) injection 0-5 Units, 0-5 Units, Subcutaneous, QHS, Standley Brooking, MD, 2 Units at 04/06/20 2135 .  insulin aspart (novoLOG) injection 0-6 Units, 0-6 Units, Subcutaneous, TID WC, Standley Brooking, MD, 2 Units at 04/07/20 1703 .  magnesium hydroxide (MILK OF MAGNESIA) suspension 30 mL, 30 mL, Oral, Daily PRN, Mansy, Jan A, MD .  MEDLINE mouth rinse, 15 mL, Mouth Rinse, q12n4p, Standley Brooking, MD, 15 mL at 04/06/20 1659 .  menthol-cetylpyridinium (CEPACOL) lozenge 3 mg, 1 lozenge, Oral, PRN, Sreenath, Sudheer B, MD .  methylPREDNISolone sodium succinate (SOLU-MEDROL) 125 mg/2 mL injection 60 mg, 60 mg, Intravenous, Q12H, Karna Christmas, Lateesha Bezold, MD, 60 mg at 04/07/20 1054 .  ondansetron (ZOFRAN) tablet 4 mg, 4 mg, Oral, Q6H PRN **OR** ondansetron (ZOFRAN) injection 4 mg, 4 mg, Intravenous, Q6H PRN, Mansy, Jan A, MD .  traZODone (DESYREL) tablet 25 mg, 25 mg, Oral, QHS PRN,  Mansy, Jan A, MD .  zinc sulfate capsule 220 mg, 220 mg, Oral, Daily, Mansy, Jan A, MD, 220 mg at 04/07/20 1047    ALLERGIES   Bee venom and Iodine    REVIEW OF SYSTEMS   10 point ROS done negative   PHYSICAL EXAMINATION   Vital Signs: Temp:  [97.2 F (36.2 C)-98.4 F (36.9 C)] 97.9 F (36.6 C) (08/28 1400) Pulse Rate:  [58-104] 71 (08/28 1400) Resp:  [11-32] 15 (08/28 1400) BP: (95-123)/(60-89) 107/70 (08/28 1400) SpO2:  [86 %-100 %] 100 % (08/28 1400) FiO2 (%):  [100 %] 100 % (08/28 1732)  GENERAL:NAD age appropirate speaking in full sentences on 100%Fio2 HEAD: Normocephalic, atraumatic.  EYES: Pupils equal, round, reactive to light.  No scleral icterus.  MOUTH: Moist mucosal membrane. NECK: Supple. No thyromegaly. No nodules. No JVD.  PULMONARY: rhonchi b/l CARDIOVASCULAR: S1 and S2. Regular rate and rhythm. No murmurs, rubs, or gallops.  GASTROINTESTINAL: Soft, nontender, non-distended. No masses. Positive bowel sounds. No hepatosplenomegaly.  MUSCULOSKELETAL: No swelling, clubbing, or edema.  NEUROLOGIC: grossly intact SKIN:intact,warm,dry   PERTINENT DATA     Infusions:  Scheduled Medications: . apixaban  5 mg Oral BID  . vitamin C  500 mg Oral Daily  . aspirin EC  325 mg Oral Daily  . baricitinib  4 mg Oral Daily  . chlorhexidine  15 mL Mouth Rinse BID  . Chlorhexidine Gluconate Cloth  6 each Topical Daily  . cholecalciferol  1,000 Units Oral Daily  . diltiazem  180 mg Oral Q12H  . famotidine  20 mg Oral BID  . feeding supplement (ENSURE ENLIVE)  237 mL Oral TID BM  . guaiFENesin  600 mg Oral BID  . insulin aspart  0-5 Units Subcutaneous QHS  . insulin aspart  0-6 Units Subcutaneous TID WC  . mouth rinse  15 mL Mouth Rinse q12n4p  . methylPREDNISolone (SOLU-MEDROL) injection  60 mg Intravenous Q12H  . zinc sulfate  220 mg Oral Daily   PRN Medications: acetaminophen, chlorpheniramine-HYDROcodone, guaiFENesin-dextromethorphan, magnesium  hydroxide, menthol-cetylpyridinium, ondansetron **OR** ondansetron (ZOFRAN) IV, traZODone Hemodynamic parameters:   Intake/Output: 08/27 0701 - 08/28 0700 In: 350 [P.O.:350] Out: 2250 [Urine:2250]  Ventilator  Settings: FiO2 (%):  [100 %] 100 %   LAB RESULTS:  Basic Metabolic Panel: Recent Labs  Lab 04-11-2020 1912 April 11, 2020 1912 04/02/20 0511 04/03/20 0422 04/03/20 0422 04/04/20 5631 04/04/20 0338 04/05/20 0408 04/06/20 0439 04/07/20 0830  NA 133*  --   --  136  --  133*  --  137  --  135  K 3.9   < >  --  3.9   < > 4.2   < > 4.1  --  4.4  CL 97*  --   --  102  --  100  --  101  --  92*  CO2 23  --   --  23  --  24  --  26  --  32  GLUCOSE 131*  --   --  141*  --  146*  --  170*  --  240*  BUN 28*  --   --  24*  --  27*  --  27*  --  37*  CREATININE 0.88  --   --  0.55*  --  0.71  --  0.68  --  0.81  CALCIUM 8.1*  --   --  7.9*  --  8.0*  --  7.9*  --  8.1*  MG  --   --  2.0 2.1  --  2.3  --  2.2 2.4  --    < > = values in this interval not displayed.   Liver Function Tests: Recent Labs  Lab 25-Feb-2020 1912 04/03/20 0422 04/04/20 0338 04/05/20 0408 04/07/20 0830  AST 56* 47* 61* 50* 33  ALT 33 30 47* 58* 48*  ALKPHOS 30* 31* 38 45 49  BILITOT 0.7 0.7 1.0 1.0 1.0  PROT 6.7 5.9* 6.0* 5.7* 5.9*  ALBUMIN 2.8* 2.5* 2.6* 2.4* 2.4*   No results for input(s): LIPASE, AMYLASE in the last 168 hours. No results for input(s): AMMONIA in the last 168 hours. CBC: Recent Labs  Lab 04/03/20 0422 04/04/20 0338 04/05/20 0408 04/06/20 0439 04/07/20 0830  WBC 6.4 8.9 10.9* 14.4* 17.4*  NEUTROABS 5.3 7.7 9.7* 13.1* 16.4*  HGB 13.4 13.2 13.1 13.4 13.8  HCT 39.4 38.7* 38.0* 39.4 40.1  MCV 88.1 88.6 88.2 88.9 86.4  PLT 194 222 245 272 271   Cardiac Enzymes: No results for input(s): CKTOTAL, CKMB, CKMBINDEX, TROPONINI in the last 168 hours. BNP: Invalid input(s): POCBNP CBG: Recent Labs  Lab 04/06/20 1652 04/06/20 2132 04/07/20 0748 04/07/20 1139 04/07/20 1647    GLUCAP 186* 245* 228* 229* 211*       IMAGING RESULTS:  Imaging: No results found.   ASSESSMENT AND PLAN    -Multidisciplinary rounds held today  Acute Hypoxic Respiratory Failure Acute COVID19 pneumonia -Remdesevir antiviral - pharmacy protocol 5 d -vitamin C -zinc -decadron 6mg  IV daily  -Diuresis - Lasix 40 IV daily - monitor UOP - utilize external urinary catheter if possible -Self prone if patient can tolerate  -encourage to use IS and Acapella device for bronchopulmonary hygiene when able -d/c hepatotoxic medications while on remdesevir -supportive care with ICU telemetry monitoring -PT/OT when possible -procalcitonin, CRP and ferritin trending    Atrial fibrillation  -on eliquis and cardizem -rate controlled    ID -continue IV abx as prescibed -follow up cultures  GI/Nutrition GI PROPHYLAXIS as indicated DIET-->TF's as tolerated Constipation protocol as indicated  ENDO - ICU hypoglycemic\Hyperglycemia protocol -check FSBS per protocol   ELECTROLYTES -follow labs as needed -replace as needed -pharmacy consultation   DVT/GI PRX ordered -SCDs  TRANSFUSIONS AS NEEDED MONITOR FSBS ASSESS the need for LABS as needed   Critical care provider statement:    Critical care time (minutes):  33   Critical care time was exclusive of:  Separately billable procedures and treating other  patients   Critical care was necessary to treat or prevent imminent or life-threatening deterioration of the following conditions:  acute hypoxemic respiratory failure due to COVID19   Critical care was time spent personally by me on the following activities:  Development of treatment plan with patient or surrogate, discussions with consultants, evaluation of patient's response to treatment, examination of patient, obtaining history from patient or surrogate, ordering and performing treatments and interventions, ordering and review of laboratory studies and re-evaluation of  patient's condition.  I assumed direction of critical care for this patient from another provider in my specialty: no    This document was prepared using Dragon voice recognition software and may include unintentional dictation errors.    Vida Rigger, M.D.  Division of Pulmonary & Critical Care Medicine  [facility]

## 2020-04-07 NOTE — Progress Notes (Signed)
assissted wife with tele-call x 2 with technical difficulty, Pt and elink RN can see and hear pt's wife yet wife cannot see her husband

## 2020-04-07 NOTE — Progress Notes (Signed)
CRITICAL CARE PROGRESS NOTE    Name: Johnny Shepard MRN: [id number] DOB: May 14, 1951     LOS: 6   SUBJECTIVE FINDINGS & SIGNIFICANT EVENTS    Patient description:  69 year old man PMH atrial fibrillation presented with shortness of breath, recent outpatient diagnosis with Covid. Admitted for acute hypoxic respiratory failure secondary to Covid multifocal pneumonia.   04/07/20- no overnight events patient verbalizes in full sentences, relates clinical improvement.   Lines/tubes :   Microbiology/Sepsis markers: Results for orders placed or performed during the hospital encounter of Apr 14, 2020  SARS Coronavirus 2 by RT PCR (hospital order, performed in [facility] hospital lab) Nasopharyngeal Nasopharyngeal Swab     Status: Abnormal   Collection Time: 04-14-2020  7:53 PM   Specimen: Nasopharyngeal Swab  Result Value Ref Range Status   SARS Coronavirus 2 POSITIVE (A) NEGATIVE Final    Comment: RESULT CALLED TO, READ BACK BY AND VERIFIED WITH: LEA FERGUISON 2020/04/14 AT 2115 BY AR (NOTE) SARS-CoV-2 target nucleic acids are DETECTED  SARS-CoV-2 RNA is generally detectable in upper respiratory specimens  during the acute phase of infection.  Positive results are indicative  of the presence of the identified virus, but do not rule out bacterial infection or co-infection with other pathogens not detected by the test.  Clinical correlation with patient history and  other diagnostic information is necessary to determine patient infection status.  The expected result is negative.  Fact Sheet for Patients:   BoilerBrush.com.cy   Fact Sheet for Healthcare Providers:   https://pope.com/    This test is not yet approved or cleared by the [place] FDA and  has been  authorized for detection and/or diagnosis of SARS-CoV-2 by FDA under an Emergency Use Authorization (EUA).  This EUA will remain in effect (meaning this t est can be used) for the duration of  the COVID-19 declaration under Section 564(b)(1) of the Act, 21 U.S.C. section 360-bbb-3(b)(1), unless the authorization is terminated or revoked sooner.  Performed at [facility], [address] [place], [place] [address]   Culture, blood (Routine X 2) w Reflex to ID Panel     Status: None   Collection Time: 04-14-20  9:20 PM   Specimen: BLOOD  Result Value Ref Range Status   Specimen Description BLOOD BLOOD RIGHT FOREARM  Final   Special Requests   Final    BOTTLES DRAWN AEROBIC AND ANAEROBIC Blood Culture adequate volume   Culture   Final    NO GROWTH 5 DAYS Performed at [facility], [address] [place], [place] [address]    Report Status 04/06/2020 FINAL  Final  Culture, blood (Routine X 2) w Reflex to ID Panel     Status: None   Collection Time: 14-Apr-2020  9:31 PM   Specimen: BLOOD  Result Value Ref Range Status   Specimen Description BLOOD RIGHT ANTECUBITAL  Final   Special Requests   Final    BOTTLES DRAWN AEROBIC AND ANAEROBIC Blood Culture results may not be optimal due to an excessive volume of blood received in culture bottles   Culture   Final    NO GROWTH 5 DAYS Performed at [facility], [address] [place], [place] [address]    Report Status 04/06/2020 FINAL  Final  MRSA PCR Screening     Status: None   Collection Time: 04/02/20 10:00 AM   Specimen: Nasal Mucosa; Nasopharyngeal  Result Value Ref Range Status   MRSA by PCR NEGATIVE NEGATIVE Final  Comment:        The GeneXpert MRSA Assay (FDA approved for NASAL specimens only), is one component of a comprehensive MRSA colonization surveillance program. It is not intended to diagnose MRSA infection nor to guide or monitor treatment for MRSA infections. Performed at [facility], [address] [place], [place] [address]     Anti-infectives:  Anti-infectives (From admission, onward)   Start     Dose/Rate Route Frequency Ordered Stop   04/02/20 1000  remdesivir 100 mg in sodium chloride 0.9 % 100 mL IVPB       "Followed by" Linked Group Details   100 mg 200 mL/hr over 30 Minutes Intravenous Daily 03/21/2020 2025 04/05/20 1947   03/25/2020 2200  remdesivir 200 mg in sodium chloride 0.9% 250 mL IVPB       "Followed by" Linked Group Details   200 mg 580 mL/hr over 30 Minutes Intravenous Once 03/22/2020 2025 04/02/20 0015         PAST MEDICAL HISTORY   Past Medical History:  Diagnosis Date  . A-fib [facility])      SURGICAL HISTORY   Past Surgical History:  Procedure Laterality Date  . HERNIA REPAIR    . KNEE ARTHROSCOPY WITH MEDIAL MENISECTOMY Left 09/05/2015   Procedure: LEFT KNEE ARTHROSCOPY WITH PARTIAL MEDIAL MENISCECTOMY and synovectomy;  Surgeon: Tarry Kos, MD;  Location: [facility];  Service: Orthopedics;  Laterality: Left;     FAMILY HISTORY   History reviewed. No pertinent family history.   SOCIAL HISTORY   Social History   Tobacco Use  . Smoking status: Never Smoker  . Smokeless tobacco: Never Used  Substance Use Topics  . Alcohol use: No  . Drug use: No     MEDICATIONS   Current Medication:  Current Facility-Administered Medications:  .  acetaminophen (TYLENOL) tablet 650 mg, 650 mg, Oral, Q6H PRN, Mansy, Jan A, MD, 650 mg at 04/06/20 1740 .  apixaban (ELIQUIS) tablet 5 mg, 5 mg, Oral, BID, Sreenath, Sudheer B, MD, 5 mg at 04/06/20 2135 .  ascorbic acid (VITAMIN C) tablet 500 mg, 500 mg, Oral, Daily, Mansy, Jan A, MD, 500 mg at 04/06/20 0800 .  aspirin EC tablet 325 mg, 325 mg, Oral, Daily, Mansy, Jan A, MD, 325 mg at 04/06/20 0750 .  baricitinib (OLUMIANT) tablet 4 mg, 4 mg, Oral, Daily, Standley Brooking, MD, 4 mg at 04/06/20 0749 .  chlorhexidine (PERIDEX) 0.12 % solution 15 mL, 15 mL, Mouth  Rinse, BID, Standley Brooking, MD, 15 mL at 04/06/20 2135 .  Chlorhexidine Gluconate Cloth 2 % PADS 6 each, 6 each, Topical, Daily, Standley Brooking, MD, 6 each at 04/06/20 [phone]  chlorpheniramine-HYDROcodone (TUSSIONEX) 10-8 MG/5ML suspension 5 mL, 5 mL, Oral, Q12H PRN, Mansy, Jan A, MD .  cholecalciferol (VITAMIN D3) tablet 1,000 Units, 1,000 Units, Oral, Daily, Mansy, Vernetta Honey, MD, 1,000 Units at 04/06/20 0750 .  diltiazem (CARDIZEM CD) 24 hr capsule 180 mg, 180 mg, Oral, Q12H, Standley Brooking, MD, 180 mg at 04/06/20 2134 .  famotidine (PEPCID) tablet 20 mg, 20 mg, Oral, BID, Mansy, Jan A, MD, 20 mg at 04/06/20 2135 .  feeding supplement (ENSURE ENLIVE) (ENSURE ENLIVE) liquid 237 mL, 237 mL, Oral, TID BM, Standley Brooking, MD, 237 mL at 04/07/20 1044 .  guaiFENesin (MUCINEX) 12 hr tablet 600 mg, 600 mg, Oral, BID, Mansy, Jan A, MD, 600 mg at 04/06/20 2134 .  guaiFENesin-dextromethorphan (ROBITUSSIN DM) 100-10 MG/5ML syrup  10 mL, 10 mL, Oral, Q4H PRN, Mansy, Jan A, MD, 10 mL at 04/06/20 0111 .  insulin aspart (novoLOG) injection 0-5 Units, 0-5 Units, Subcutaneous, QHS, Standley Brooking, MD, 2 Units at 04/06/20 2135 .  insulin aspart (novoLOG) injection 0-6 Units, 0-6 Units, Subcutaneous, TID WC, Standley Brooking, MD, 2 Units at 04/07/20 0802 .  magnesium hydroxide (MILK OF MAGNESIA) suspension 30 mL, 30 mL, Oral, Daily PRN, Mansy, Jan A, MD .  MEDLINE mouth rinse, 15 mL, Mouth Rinse, q12n4p, Standley Brooking, MD, 15 mL at 04/06/20 1659 .  menthol-cetylpyridinium (CEPACOL) lozenge 3 mg, 1 lozenge, Oral, PRN, Sreenath, Sudheer B, MD .  methylPREDNISolone sodium succinate (SOLU-MEDROL) 125 mg/2 mL injection 60 mg, 60 mg, Intravenous, Q12H, Karna Christmas, Pegi Milazzo, MD, 60 mg at 04/06/20 2134 .  ondansetron (ZOFRAN) tablet 4 mg, 4 mg, Oral, Q6H PRN **OR** ondansetron (ZOFRAN) injection 4 mg, 4 mg, Intravenous, Q6H PRN, Mansy, Jan A, MD .  traZODone (DESYREL) tablet 25 mg, 25 mg, Oral, QHS PRN,  Mansy, Jan A, MD .  zinc sulfate capsule 220 mg, 220 mg, Oral, Daily, Mansy, Jan A, MD, 220 mg at 04/06/20 0749    ALLERGIES   Bee venom and Iodine    REVIEW OF SYSTEMS   10 point ROS done negative   PHYSICAL EXAMINATION   Vital Signs: Temp:  [97 F (36.1 C)-97.9 F (36.6 C)] 97.9 F (36.6 C) (08/28 0400) Pulse Rate:  [58-104] 67 (08/28 0700) Resp:  [11-28] 13 (08/28 0700) BP: (95-123)/(68-90) 119/81 (08/28 0700) SpO2:  [87 %-100 %] 99 % (08/28 0700) FiO2 (%):  [100 %] 100 % (08/28 0600)  GENERAL:NAD age appropirate speaking in full sentences on 100%Fio2 HEAD: Normocephalic, atraumatic.  EYES: Pupils equal, round, reactive to light.  No scleral icterus.  MOUTH: Moist mucosal membrane. NECK: Supple. No thyromegaly. No nodules. No JVD.  PULMONARY: rhonchi b/l CARDIOVASCULAR: S1 and S2. Regular rate and rhythm. No murmurs, rubs, or gallops.  GASTROINTESTINAL: Soft, nontender, non-distended. No masses. Positive bowel sounds. No hepatosplenomegaly.  MUSCULOSKELETAL: No swelling, clubbing, or edema.  NEUROLOGIC: grossly intact SKIN:intact,warm,dry   PERTINENT DATA     Infusions:  Scheduled Medications: . apixaban  5 mg Oral BID  . vitamin C  500 mg Oral Daily  . aspirin EC  325 mg Oral Daily  . baricitinib  4 mg Oral Daily  . chlorhexidine  15 mL Mouth Rinse BID  . Chlorhexidine Gluconate Cloth  6 each Topical Daily  . cholecalciferol  1,000 Units Oral Daily  . diltiazem  180 mg Oral Q12H  . famotidine  20 mg Oral BID  . feeding supplement (ENSURE ENLIVE)  237 mL Oral TID BM  . guaiFENesin  600 mg Oral BID  . insulin aspart  0-5 Units Subcutaneous QHS  . insulin aspart  0-6 Units Subcutaneous TID WC  . mouth rinse  15 mL Mouth Rinse q12n4p  . methylPREDNISolone (SOLU-MEDROL) injection  60 mg Intravenous Q12H  . zinc sulfate  220 mg Oral Daily   PRN Medications: acetaminophen, chlorpheniramine-HYDROcodone, guaiFENesin-dextromethorphan, magnesium hydroxide,  menthol-cetylpyridinium, ondansetron **OR** ondansetron (ZOFRAN) IV, traZODone Hemodynamic parameters:   Intake/Output: 08/27 0701 - 08/28 0700 In: 350 [P.O.:350] Out: 2250 [Urine:2250]  Ventilator  Settings: FiO2 (%):  [100 %] 100 %   LAB RESULTS:  Basic Metabolic Panel: Recent Labs  Lab 03/12/2020 1912 03/24/2020 1912 04/02/20 0511 04/03/20 0422 04/03/20 0422 04/04/20 0338 04/05/20 0408 04/06/20 0439  NA 133*  --   --  136  --  133* 137  --   K 3.9   < >  --  3.9   < > 4.2 4.1  --   CL 97*  --   --  102  --  100 101  --   CO2 23  --   --  23  --  24 26  --   GLUCOSE 131*  --   --  141*  --  146* 170*  --   BUN 28*  --   --  24*  --  27* 27*  --   CREATININE 0.88  --   --  0.55*  --  0.71 0.68  --   CALCIUM 8.1*  --   --  7.9*  --  8.0* 7.9*  --   MG  --   --  2.0 2.1  --  2.3 2.2 2.4   < > = values in this interval not displayed.   Liver Function Tests: Recent Labs  Lab 08/03/2020 1912 04/03/20 0422 04/04/20 0338 04/05/20 0408  AST 56* 47* 61* 50*  ALT 33 30 47* 58*  ALKPHOS 30* 31* 38 45  BILITOT 0.7 0.7 1.0 1.0  PROT 6.7 5.9* 6.0* 5.7*  ALBUMIN 2.8* 2.5* 2.6* 2.4*   No results for input(s): LIPASE, AMYLASE in the last 168 hours. No results for input(s): AMMONIA in the last 168 hours. CBC: Recent Labs  Lab 04/03/20 0422 04/04/20 0338 04/05/20 0408 04/06/20 0439 04/07/20 0830  WBC 6.4 8.9 10.9* 14.4* 17.4*  NEUTROABS 5.3 7.7 9.7* 13.1* 16.4*  HGB 13.4 13.2 13.1 13.4 13.8  HCT 39.4 38.7* 38.0* 39.4 40.1  MCV 88.1 88.6 88.2 88.9 86.4  PLT 194 222 245 272 271   Cardiac Enzymes: No results for input(s): CKTOTAL, CKMB, CKMBINDEX, TROPONINI in the last 168 hours. BNP: Invalid input(s): POCBNP CBG: Recent Labs  Lab 04/06/20 0741 04/06/20 1120 04/06/20 1652 04/06/20 2132 04/07/20 0748  GLUCAP 175* 154* 186* 245* 228*       IMAGING RESULTS:  Imaging: No results found.   ASSESSMENT AND PLAN    -Multidisciplinary rounds held today  Acute  Hypoxic Respiratory Failure Acute COVID19 pneumonia -Remdesevir antiviral - pharmacy protocol 5 d -vitamin C -zinc -decadron 6mg  IV daily  -Diuresis - Lasix 40 IV daily - monitor UOP - utilize external urinary catheter if possible -Self prone if patient can tolerate  -encourage to use IS and Acapella device for bronchopulmonary hygiene when able -d/c hepatotoxic medications while on remdesevir -supportive care with ICU telemetry monitoring -PT/OT when possible -procalcitonin, CRP and ferritin trending    Atrial fibrillation  -on eliquis and cardizem -rate controlled    ID -continue IV abx as prescibed -follow up cultures  GI/Nutrition GI PROPHYLAXIS as indicated DIET-->TF's as tolerated Constipation protocol as indicated  ENDO - ICU hypoglycemic\Hyperglycemia protocol -check FSBS per protocol   ELECTROLYTES -follow labs as needed -replace as needed -pharmacy consultation   DVT/GI PRX ordered -SCDs  TRANSFUSIONS AS NEEDED MONITOR FSBS ASSESS the need for LABS as needed   Critical care provider statement:    Critical care time (minutes):  33   Critical care time was exclusive of:  Separately billable procedures and treating other patients   Critical care was necessary to treat or prevent imminent or life-threatening deterioration of the following conditions:  acute hypoxemic respiratory failure due to COVID19   Critical care was time spent personally by me on the following activities:  Development of treatment plan with patient or surrogate, discussions with  consultants, evaluation of patient's response to treatment, examination of patient, obtaining history from patient or surrogate, ordering and performing treatments and interventions, ordering and review of laboratory studies and re-evaluation of patient's condition.  I assumed direction of critical care for this patient from another provider in my specialty: no    This document was prepared using Dragon voice  recognition software and may include unintentional dictation errors.    Vida Rigger, M.D.  Division of Pulmonary & Critical Care Medicine  [facility]

## 2020-04-07 NOTE — Progress Notes (Signed)
PROGRESS NOTE    Johnny Shepard  OZD:[id number] DOB: 1951-02-11 DOA: 04-04-20 PCP: Patient, No Pcp Per   Brief Narrative:  69 year old man PMH atrial fibrillation presented with shortness of breath, recent outpatient diagnosis with Covid.  Admitted for acute hypoxic respiratory failure secondary to Covid multifocal pneumonia.  8/25: Patient seen and examined.  Lying prone in bed.  Still on 50 L high flow in addition nonrebreather.  Saturations in the 90s on above oxygen.  Mentating clearly.  Normal work of breathing.  8/26: Patient seen and examined.  Clinically appears better today.  Remains on 50 L high flow nasal cannula and nonrebreather.  Saturations mid 90s.  Mentating clearly.  Normal work of breathing.  8/27: Patient seen and examined.  Continues to subjectively improve on my evaluation.  Sitting up in bed.  Eating.  Speaking in complete sentences.  Remains on 50 L high flow nasal cannula in addition to nonrebreather.  Saturations been 90.  Mentating very clearly.  Normal work of breathing.  8/28: Patient seen and examined.  Sitting up in bed.  Mentating clearly.  Normal work of breathing.  Was able to remove the nonrebreather without any negative effects on his saturations.  Now just on 50 L heated high flow nasal cannula.   Assessment & Plan:   Principal Problem:   Acute hypoxemic respiratory failure due to COVID-19 [facility]) Active Problems:   Pneumonia due to COVID-19 virus   Unspecified atrial fibrillation (HCC)   Thrombocytopenia (HCC)   Elevated transaminase level  Acute hypoxemic respiratory failure due to COVID-19 [facility]) --appears about the same today, remains on HHF 50L, NRB w/ SpO2 in high 80s --8/23 CXR Minimal patchy pulmonary infiltrate may be present at the left lung base and within the right mid lung zone --oxygen HHF 50L --inflammatory markers  Ferritin 1605 > 1551 > 1558>1456>1023  CRP 5.9 > 4.8 > 2.5 >1.1>0.7  Fibrin derivatives 935 > 819 > 1437  >2608>7500  CTA could not be performed.  Lower extremity Doppler negative for PE --Tx  Remdesivir 8/22 > 04/05/20  Steroids 8/22 >   Baricitinib 8/23 >  Prone  Mobility as tolerated Lasix 40 mg IV daily.  Monitor kidney function while on diuresis  Unspecified atrial fibrillation (HCC) S/p Watchman procedure at [facility] --stable; continue apixaban, diltiazem  Pneumonia due to COVID-19 virus --plan as above  Thrombocytopenia (HCC) --secondary to acute illness, resolved.  Elevated transaminase level --AST slightly decreased. Will monitor w/ daily CMP   DVT prophylaxis: Eliquis Code Status: Full Family Communication: Wife via phone [phone] on 04/07/2020 Disposition Plan:Status is: Inpatient  Remains inpatient appropriate because:Inpatient level of care appropriate due to severity of illness   Dispo: The patient is from: Home              Anticipated d/c is to: Home              Anticipated d/c date is: > 3 days              Patient currently is not medically stable to d/c.   Still severe acute hypoxic respiratory failure secondary to COVID-19 pneumonia.  Remains on heated high flow 50 L, 100%.  We were able to remove the nonrebreather mask today and patient is maintaining his saturations.  Clinically he appears well.     Consultants:   None  Procedures:   None  Antimicrobials:   Remdesivir   Subjective: Patient seen and examined.  Remains on heated high flow 50  L.  Nonrebreather titrated off.  Patient maintaining saturations.  Normal work of breathing.  Normal mentation.  Objective: Vitals:   04/07/20 0530 04/07/20 0600 04/07/20 0700 04/07/20 1052  BP: 115/89 111/75 119/81 112/76  Pulse: 67 69 67   Resp: 16 17 13    Temp:      TempSrc:      SpO2: 97% 97% 99%   Weight:      Height:        Intake/Output Summary (Last 24 hours) at 04/07/2020 1314 Last data filed at 04/07/2020 0757 Gross per 24 hour  Intake 350 ml  Output 2200 ml  Net -1850  ml   Filed Weights   04/22/20 1852 04/02/20 0915  Weight: 79 kg 84.7 kg    Examination:  General: No apparent distress, patient appears well HEENT: Normocephalic, atraumatic Neck, supple, trachea midline, no tenderness Heart: Regular rate and rhythm, S1/S2 normal, no murmurs Lungs: Bibasilar crackles.  Normal work of breathing.  50 L heated high flow Abdomen: Soft, nontender, nondistended, positive bowel sounds Extremities: Normal, atraumatic, no clubbing or cyanosis, normal muscle tone Skin: No rashes or lesions, normal color Neurologic: Cranial nerves grossly intact, sensation intact, alert and oriented x3 Psychiatric: Normal affect      Data Reviewed: I have personally reviewed following labs and imaging studies  CBC: Recent Labs  Lab 04/03/20 0422 04/04/20 0338 04/05/20 0408 04/06/20 0439 04/07/20 0830  WBC 6.4 8.9 10.9* 14.4* 17.4*  NEUTROABS 5.3 7.7 9.7* 13.1* 16.4*  HGB 13.4 13.2 13.1 13.4 13.8  HCT 39.4 38.7* 38.0* 39.4 40.1  MCV 88.1 88.6 88.2 88.9 86.4  PLT 194 222 245 272 271   Basic Metabolic Panel: Recent Labs  Lab 04/22/2020 1912 04/02/20 0511 04/03/20 0422 04/04/20 0338 04/05/20 0408 04/06/20 0439 04/07/20 0830  NA 133*  --  136 133* 137  --  135  K 3.9  --  3.9 4.2 4.1  --  4.4  CL 97*  --  102 100 101  --  92*  CO2 23  --  23 24 26   --  32  GLUCOSE 131*  --  141* 146* 170*  --  240*  BUN 28*  --  24* 27* 27*  --  37*  CREATININE 0.88  --  0.55* 0.71 0.68  --  0.81  CALCIUM 8.1*  --  7.9* 8.0* 7.9*  --  8.1*  MG  --  2.0 2.1 2.3 2.2 2.4  --    GFR: Estimated Creatinine Clearance: 94.5 mL/min (by C-G formula based on SCr of 0.81 mg/dL). Liver Function Tests: Recent Labs  Lab 22-Apr-2020 1912 04/03/20 0422 04/04/20 0338 04/05/20 0408 04/07/20 0830  AST 56* 47* 61* 50* 33  ALT 33 30 47* 58* 48*  ALKPHOS 30* 31* 38 45 49  BILITOT 0.7 0.7 1.0 1.0 1.0  PROT 6.7 5.9* 6.0* 5.7* 5.9*  ALBUMIN 2.8* 2.5* 2.6* 2.4* 2.4*   No results for  input(s): LIPASE, AMYLASE in the last 168 hours. No results for input(s): AMMONIA in the last 168 hours. Coagulation Profile: No results for input(s): INR, PROTIME in the last 168 hours. Cardiac Enzymes: No results for input(s): CKTOTAL, CKMB, CKMBINDEX, TROPONINI in the last 168 hours. BNP (last 3 results) No results for input(s): PROBNP in the last 8760 hours. HbA1C: No results for input(s): HGBA1C in the last 72 hours. CBG: Recent Labs  Lab 04/06/20 1120 04/06/20 1652 04/06/20 2132 04/07/20 0748 04/07/20 1139  GLUCAP 154* 186* 245* 228* 229*  Lipid Profile: No results for input(s): CHOL, HDL, LDLCALC, TRIG, CHOLHDL, LDLDIRECT in the last 72 hours. Thyroid Function Tests: No results for input(s): TSH, T4TOTAL, FREET4, T3FREE, THYROIDAB in the last 72 hours. Anemia Panel: Recent Labs    04/05/20 0408 04/06/20 0439  FERRITIN 1,022* 1,023*   Sepsis Labs: Recent Labs  Lab Nov 23, 2019 1912 Nov 23, 2019 2120  PROCALCITON <0.10 <0.10    Recent Results (from the past 240 hour(s))  SARS Coronavirus 2 by RT PCR (hospital order, performed [facility]) Nasopharyngeal Nasopharyngeal Swab     Status: Abnormal   Collection Time: Nov 23, 2019  7:53 PM   Specimen: Nasopharyngeal Swab  Result Value Ref Range Status   SARS Coronavirus 2 POSITIVE (A) NEGATIVE Final    Comment: RESULT CALLED TO, READ BACK BY AND VERIFIED WITH: LEA FERGUISON 05/01/20 AT 2115 BY AR (NOTE) SARS-CoV-2 target nucleic acids are DETECTED  SARS-CoV-2 RNA is generally detectable in upper respiratory specimens  during the acute phase of infection.  Positive results are indicative  of the presence of the identified virus, but do not rule out bacterial infection or co-infection with other pathogens not detected by the test.  Clinical correlation with patient history and  other diagnostic information is necessary to determine patient infection status.  The expected result is negative.  Fact Sheet for  Patients:   BoilerBrush.com.cyhttps://www.fda.gov/media/136312/download   Fact Sheet for Healthcare Providers:   https://pope.com/https://www.fda.gov/media/136313/download    This test is not yet approved or cleared by the [place] FDA and  has been authorized for detection and/or diagnosis of SARS-CoV-2 by FDA under an Emergency Use Authorization (EUA).  This EUA will remain in effect (meaning this t est can be used) for the duration of  the COVID-19 declaration under Section 564(b)(1) of the Act, 21 U.S.C. section 360-bbb-3(b)(1), unless the authorization is terminated or revoked sooner.  Performed at [facility], [address] [place], [place] [address]   Culture, blood (Routine X 2) w Reflex to ID Panel     Status: None   Collection Time: Nov 23, 2019  9:20 PM   Specimen: BLOOD  Result Value Ref Range Status   Specimen Description BLOOD BLOOD RIGHT FOREARM  Final   Special Requests   Final    BOTTLES DRAWN AEROBIC AND ANAEROBIC Blood Culture adequate volume   Culture   Final    NO GROWTH 5 DAYS Performed at [facility], [address] [place], [place] [address]    Report Status 04/06/2020 FINAL  Final  Culture, blood (Routine X 2) w Reflex to ID Panel     Status: None   Collection Time: Nov 23, 2019  9:31 PM   Specimen: BLOOD  Result Value Ref Range Status   Specimen Description BLOOD RIGHT ANTECUBITAL  Final   Special Requests   Final    BOTTLES DRAWN AEROBIC AND ANAEROBIC Blood Culture results may not be optimal due to an excessive volume of blood received in culture bottles   Culture   Final    NO GROWTH 5 DAYS Performed at [facility], [address] [place], [place] [address]    Report Status 04/06/2020 FINAL  Final  MRSA PCR Screening     Status: None   Collection Time: 04/02/20 10:00 AM   Specimen: Nasal Mucosa; Nasopharyngeal  Result Value Ref Range Status   MRSA by PCR NEGATIVE NEGATIVE Final    Comment:        The GeneXpert MRSA Assay (FDA approved for  NASAL specimens  only), is one component of a comprehensive MRSA colonization surveillance program. It is not intended to diagnose MRSA infection nor to guide or monitor treatment for MRSA infections. Performed at [facility], [address] [place], [place] [address]          Radiology Studies: No results found.      Scheduled Meds: . apixaban  5 mg Oral BID  . vitamin C  500 mg Oral Daily  . aspirin EC  325 mg Oral Daily  . baricitinib  4 mg Oral Daily  . chlorhexidine  15 mL Mouth Rinse BID  . Chlorhexidine Gluconate Cloth  6 each Topical Daily  . cholecalciferol  1,000 Units Oral Daily  . diltiazem  180 mg Oral Q12H  . famotidine  20 mg Oral BID  . feeding supplement (ENSURE ENLIVE)  237 mL Oral TID BM  . guaiFENesin  600 mg Oral BID  . insulin aspart  0-5 Units Subcutaneous QHS  . insulin aspart  0-6 Units Subcutaneous TID WC  . mouth rinse  15 mL Mouth Rinse q12n4p  . methylPREDNISolone (SOLU-MEDROL) injection  60 mg Intravenous Q12H  . zinc sulfate  220 mg Oral Daily   Continuous Infusions:    LOS: 6 days    Time spent: 25 minutes    Tresa Moore, MD [facility] Pager [phone]  If 7PM-7AM, please contact night-coverage 04/07/2020, 1:14 PM

## 2020-04-08 LAB — CBC WITH DIFFERENTIAL/PLATELET
Abs Immature Granulocytes: 0.16 10*3/uL — ABNORMAL HIGH (ref 0.00–0.07)
Basophils Absolute: 0 10*3/uL (ref 0.0–0.1)
Basophils Relative: 0 %
Eosinophils Absolute: 0 10*3/uL (ref 0.0–0.5)
Eosinophils Relative: 0 %
HCT: 35.8 % — ABNORMAL LOW (ref 39.0–52.0)
Hemoglobin: 12.8 g/dL — ABNORMAL LOW (ref 13.0–17.0)
Immature Granulocytes: 1 %
Lymphocytes Relative: 3 %
Lymphs Abs: 0.3 10*3/uL — ABNORMAL LOW (ref 0.7–4.0)
MCH: 30.7 pg (ref 26.0–34.0)
MCHC: 35.8 g/dL (ref 30.0–36.0)
MCV: 85.9 fL (ref 80.0–100.0)
Monocytes Absolute: 0.4 10*3/uL (ref 0.1–1.0)
Monocytes Relative: 3 %
Neutro Abs: 11.7 10*3/uL — ABNORMAL HIGH (ref 1.7–7.7)
Neutrophils Relative %: 93 %
Platelets: 251 10*3/uL (ref 150–400)
RBC: 4.17 MIL/uL — ABNORMAL LOW (ref 4.22–5.81)
RDW: 13.6 % (ref 11.5–15.5)
WBC: 12.5 10*3/uL — ABNORMAL HIGH (ref 4.0–10.5)
nRBC: 0 % (ref 0.0–0.2)

## 2020-04-08 LAB — COMPREHENSIVE METABOLIC PANEL
ALT: 44 U/L (ref 0–44)
AST: 24 U/L (ref 15–41)
Albumin: 2.1 g/dL — ABNORMAL LOW (ref 3.5–5.0)
Alkaline Phosphatase: 46 U/L (ref 38–126)
Anion gap: 6 (ref 5–15)
BUN: 33 mg/dL — ABNORMAL HIGH (ref 8–23)
CO2: 34 mmol/L — ABNORMAL HIGH (ref 22–32)
Calcium: 7.9 mg/dL — ABNORMAL LOW (ref 8.9–10.3)
Chloride: 94 mmol/L — ABNORMAL LOW (ref 98–111)
Creatinine, Ser: 0.67 mg/dL (ref 0.61–1.24)
GFR calc Af Amer: >60 mL/min (ref >60)
GFR calc non Af Amer: >60 mL/min (ref >60)
Glucose, Bld: 183 mg/dL — ABNORMAL HIGH (ref 70–99)
Potassium: 3.9 mmol/L (ref 3.5–5.1)
Sodium: 134 mmol/L — ABNORMAL LOW (ref 135–145)
Total Bilirubin: 1.2 mg/dL (ref 0.3–1.2)
Total Protein: 5.3 g/dL — ABNORMAL LOW (ref 6.5–8.1)

## 2020-04-08 LAB — GLUCOSE, CAPILLARY
Glucose-Capillary: 148 mg/dL — ABNORMAL HIGH (ref 70–99)
Glucose-Capillary: 177 mg/dL — ABNORMAL HIGH (ref 70–99)
Glucose-Capillary: 203 mg/dL — ABNORMAL HIGH (ref 70–99)
Glucose-Capillary: 203 mg/dL — ABNORMAL HIGH (ref 70–99)

## 2020-04-08 MED ORDER — IVERMECTIN 3 MG PO TABS
150.0000 ug/kg | ORAL_TABLET | Freq: Once | ORAL | Status: AC
  Administered 2020-04-08: 12000 ug via ORAL
  Filled 2020-04-08 (×2): qty 4

## 2020-04-08 NOTE — Progress Notes (Signed)
CRITICAL CARE PROGRESS NOTE    Name: Johnny Shepard MRN: [id number] DOB: 09-15-1950     LOS: 7   SUBJECTIVE FINDINGS & SIGNIFICANT EVENTS    Patient description:  69 year old man PMH atrial fibrillation presented with shortness of breath, recent outpatient diagnosis with Covid. Admitted for acute hypoxic respiratory failure secondary to Covid multifocal pneumonia.   04/07/20- no overnight events patient verbalizes in full sentences, relates clinical improvement.  04/08/20- patient in good spirits , he is still on elevated settings with HFNC.  I have encouraged him to use IS, hes able to take tidal volume >1000  Lines/tubes :   Microbiology/Sepsis markers: Results for orders placed or performed during the hospital encounter of Apr 18, 2020  SARS Coronavirus 2 by RT PCR (hospital order, performed in [facility] hospital lab) Nasopharyngeal Nasopharyngeal Swab     Status: Abnormal   Collection Time: 04-18-2020  7:53 PM   Specimen: Nasopharyngeal Swab  Result Value Ref Range Status   SARS Coronavirus 2 POSITIVE (A) NEGATIVE Final    Comment: RESULT CALLED TO, READ BACK BY AND VERIFIED WITH: LEA FERGUISON 04-18-2020 AT 2115 BY AR (NOTE) SARS-CoV-2 target nucleic acids are DETECTED  SARS-CoV-2 RNA is generally detectable in upper respiratory specimens  during the acute phase of infection.  Positive results are indicative  of the presence of the identified virus, but do not rule out bacterial infection or co-infection with other pathogens not detected by the test.  Clinical correlation with patient history and  other diagnostic information is necessary to determine patient infection status.  The expected result is negative.  Fact Sheet for Patients:   BoilerBrush.com.cy   Fact Sheet for  Healthcare Providers:   https://pope.com/    This test is not yet approved or cleared by the [place] FDA and  has been authorized for detection and/or diagnosis of SARS-CoV-2 by FDA under an Emergency Use Authorization (EUA).  This EUA will remain in effect (meaning this t est can be used) for the duration of  the COVID-19 declaration under Section 564(b)(1) of the Act, 21 U.S.C. section 360-bbb-3(b)(1), unless the authorization is terminated or revoked sooner.  Performed at [facility], [address] [place], [place] [address]   Culture, blood (Routine X 2) w Reflex to ID Panel     Status: None   Collection Time: 2020/04/18  9:20 PM   Specimen: BLOOD  Result Value Ref Range Status   Specimen Description BLOOD BLOOD RIGHT FOREARM  Final   Special Requests   Final    BOTTLES DRAWN AEROBIC AND ANAEROBIC Blood Culture adequate volume   Culture   Final    NO GROWTH 5 DAYS Performed at [facility], [address] [place], [place] [address]    Report Status 04/06/2020 FINAL  Final  Culture, blood (Routine X 2) w Reflex to ID Panel     Status: None   Collection Time: 2020-04-18  9:31 PM   Specimen: BLOOD  Result Value Ref Range Status   Specimen Description BLOOD RIGHT ANTECUBITAL  Final   Special Requests   Final    BOTTLES DRAWN AEROBIC AND ANAEROBIC Blood Culture results may not be optimal due to an excessive volume of blood received in culture bottles   Culture   Final    NO GROWTH 5 DAYS Performed at [facility], [address] [place], [place] [address]    Report Status 04/06/2020 FINAL  Final  MRSA PCR Screening     Status:  None   Collection Time: 04/02/20 10:00 AM   Specimen: Nasal Mucosa; Nasopharyngeal  Result Value Ref Range Status   MRSA by PCR NEGATIVE NEGATIVE Final    Comment:        The GeneXpert MRSA Assay (FDA approved for NASAL specimens only), is one component of a comprehensive MRSA  colonization surveillance program. It is not intended to diagnose MRSA infection nor to guide or monitor treatment for MRSA infections. Performed at [facility], [address] [place], [place] [id number]     Anti-infectives:  Anti-infectives (From admission, onward)   Start     Dose/Rate Route Frequency Ordered Stop   04/08/20 0930  ivermectin (STROMECTOL) tablet 12,000 mcg        150 mcg/kg  84.7 kg Oral  Once 04/08/20 0844 04/08/20 1246   04/02/20 1000  remdesivir 100 mg in sodium chloride 0.9 % 100 mL IVPB       "Followed by" Linked Group Details   100 mg 200 mL/hr over 30 Minutes Intravenous Daily 03/21/2020 2025 04/05/20 1947   03/29/2020 2200  remdesivir 200 mg in sodium chloride 0.9% 250 mL IVPB       "Followed by" Linked Group Details   200 mg 580 mL/hr over 30 Minutes Intravenous Once 04/08/2020 2025 04/02/20 0015         PAST MEDICAL HISTORY   Past Medical History:  Diagnosis Date   A-fib [facility](HCC)      SURGICAL HISTORY   Past Surgical History:  Procedure Laterality Date   HERNIA REPAIR     KNEE ARTHROSCOPY WITH MEDIAL MENISECTOMY Left 09/05/2015   Procedure: LEFT KNEE ARTHROSCOPY WITH PARTIAL MEDIAL MENISCECTOMY and synovectomy;  Surgeon: Tarry KosNaiping M Xu, MD;  Location: [facility];  Service: Orthopedics;  Laterality: Left;     FAMILY HISTORY   History reviewed. No pertinent family history.   SOCIAL HISTORY   Social History   Tobacco Use   Smoking status: Never Smoker   Smokeless tobacco: Never Used  Substance Use Topics   Alcohol use: No   Drug use: No     MEDICATIONS   Current Medication:  Current Facility-Administered Medications:    acetaminophen (TYLENOL) tablet 650 mg, 650 mg, Oral, Q6H PRN, Mansy, Jan A, MD, 650 mg at 04/06/20 1740   apixaban (ELIQUIS) tablet 5 mg, 5 mg, Oral, BID, Sreenath, Sudheer B, MD, 5 mg at 04/08/20 1229   ascorbic acid (VITAMIN C) tablet 500 mg, 500 mg, Oral, Daily, Mansy, Jan A,  MD, 500 mg at 04/08/20 1229   aspirin EC tablet 325 mg, 325 mg, Oral, Daily, Mansy, Jan A, MD, 325 mg at 04/08/20 1230   baricitinib (OLUMIANT) tablet 4 mg, 4 mg, Oral, Daily, Standley BrookingGoodrich, Daniel P, MD, 4 mg at 04/08/20 1230   chlorhexidine (PERIDEX) 0.12 % solution 15 mL, 15 mL, Mouth Rinse, BID, Standley BrookingGoodrich, Daniel P, MD, 15 mL at 04/08/20 1229   Chlorhexidine Gluconate Cloth 2 % PADS 6 each, 6 each, Topical, Daily, Standley BrookingGoodrich, Daniel P, MD, 6 each at 04/08/20 1231   chlorpheniramine-HYDROcodone (TUSSIONEX) 10-8 MG/5ML suspension 5 mL, 5 mL, Oral, Q12H PRN, Mansy, Jan A, MD   cholecalciferol (VITAMIN D3) tablet 1,000 Units, 1,000 Units, Oral, Daily, Mansy, Jan A, MD, 1,000 Units at 04/08/20 1229   diltiazem (CARDIZEM CD) 24 hr capsule 180 mg, 180 mg, Oral, Q12H, Standley BrookingGoodrich, Daniel P, MD, 180 mg at 04/08/20 1230   famotidine (PEPCID) tablet 20 mg, 20 mg, Oral, BID, Mansy, Vernetta HoneyJan A, MD,  20 mg at 04/08/20 1230   feeding supplement (ENSURE ENLIVE) (ENSURE ENLIVE) liquid 237 mL, 237 mL, Oral, TID BM, Standley Brooking, MD, 237 mL at 04/08/20 1412   guaiFENesin (MUCINEX) 12 hr tablet 600 mg, 600 mg, Oral, BID, Mansy, Jan A, MD, 600 mg at 04/08/20 1230   guaiFENesin-dextromethorphan (ROBITUSSIN DM) 100-10 MG/5ML syrup 10 mL, 10 mL, Oral, Q4H PRN, Mansy, Jan A, MD, 10 mL at 04/06/20 0111   insulin aspart (novoLOG) injection 0-5 Units, 0-5 Units, Subcutaneous, QHS, Standley Brooking, MD, 4 Units at 04/07/20 2139   insulin aspart (novoLOG) injection 0-6 Units, 0-6 Units, Subcutaneous, TID WC, Standley Brooking, MD, 2 Units at 04/08/20 1629   magnesium hydroxide (MILK OF MAGNESIA) suspension 30 mL, 30 mL, Oral, Daily PRN, Mansy, Jan A, MD   MEDLINE mouth rinse, 15 mL, Mouth Rinse, q12n4p, Standley Brooking, MD, 15 mL at 04/08/20 1629   menthol-cetylpyridinium (CEPACOL) lozenge 3 mg, 1 lozenge, Oral, PRN, Georgeann Oppenheim, Sudheer B, MD   methylPREDNISolone sodium succinate (SOLU-MEDROL) 40 mg/mL injection 40  mg, 40 mg, Intravenous, Q12H, Vida Rigger, MD, 40 mg at 04/08/20 1231   ondansetron (ZOFRAN) tablet 4 mg, 4 mg, Oral, Q6H PRN **OR** ondansetron (ZOFRAN) injection 4 mg, 4 mg, Intravenous, Q6H PRN, Mansy, Jan A, MD   traZODone (DESYREL) tablet 25 mg, 25 mg, Oral, QHS PRN, Mansy, Jan A, MD   zinc sulfate capsule 220 mg, 220 mg, Oral, Daily, Mansy, Jan A, MD, 220 mg at 04/08/20 1246    ALLERGIES   Bee venom and Iodine    REVIEW OF SYSTEMS   10 point ROS done negative   PHYSICAL EXAMINATION   Vital Signs: Temp:  [97.9 F (36.6 C)-98 F (36.7 C)] 98 F (36.7 C) (08/29 1600) Pulse Rate:  [51-140] 68 (08/29 1800) Resp:  [13-25] 18 (08/29 1800) BP: (97-138)/(59-88) 100/59 (08/29 1800) SpO2:  [84 %-100 %] 91 % (08/29 1800) FiO2 (%):  [100 %] 100 % (08/29 1516)  GENERAL:NAD age appropirate speaking in full sentences on 100%Fio2 HEAD: Normocephalic, atraumatic.  EYES: Pupils equal, round, reactive to light.  No scleral icterus.  MOUTH: Moist mucosal membrane. NECK: Supple. No thyromegaly. No nodules. No JVD.  PULMONARY: rhonchi b/l CARDIOVASCULAR: S1 and S2. Regular rate and rhythm. No murmurs, rubs, or gallops.  GASTROINTESTINAL: Soft, nontender, non-distended. No masses. Positive bowel sounds. No hepatosplenomegaly.  MUSCULOSKELETAL: No swelling, clubbing, or edema.  NEUROLOGIC: grossly intact SKIN:intact,warm,dry   PERTINENT DATA     Infusions:  Scheduled Medications:  apixaban  5 mg Oral BID   vitamin C  500 mg Oral Daily   aspirin EC  325 mg Oral Daily   baricitinib  4 mg Oral Daily   chlorhexidine  15 mL Mouth Rinse BID   Chlorhexidine Gluconate Cloth  6 each Topical Daily   cholecalciferol  1,000 Units Oral Daily   diltiazem  180 mg Oral Q12H   famotidine  20 mg Oral BID   feeding supplement (ENSURE ENLIVE)  237 mL Oral TID BM   guaiFENesin  600 mg Oral BID   insulin aspart  0-5 Units Subcutaneous QHS   insulin aspart  0-6 Units  Subcutaneous TID WC   mouth rinse  15 mL Mouth Rinse q12n4p   methylPREDNISolone (SOLU-MEDROL) injection  40 mg Intravenous Q12H   zinc sulfate  220 mg Oral Daily   PRN Medications: acetaminophen, chlorpheniramine-HYDROcodone, guaiFENesin-dextromethorphan, magnesium hydroxide, menthol-cetylpyridinium, ondansetron **OR** ondansetron (ZOFRAN) IV, traZODone Hemodynamic parameters:   Intake/Output: 08/28 0701 -  08/29 0700 In: 400 [P.O.:400] Out: 1325 [Urine:1325]  Ventilator  Settings: FiO2 (%):  [100 %] 100 %   LAB RESULTS:  Basic Metabolic Panel: Recent Labs  Lab April 12, 2020 1912 04/02/20 0511 04/03/20 0422 04/03/20 0422 04/04/20 7035 04/04/20 0093 04/05/20 0408 04/05/20 0408 04/06/20 0439 04/07/20 0830 04/08/20 0517  NA   < >  --  136  --  133*  --  137  --   --  135 134*  K   < >  --  3.9   < > 4.2   < > 4.1   < >  --  4.4 3.9  CL   < >  --  102  --  100  --  101  --   --  92* 94*  CO2   < >  --  23  --  24  --  26  --   --  32 34*  GLUCOSE   < >  --  141*  --  146*  --  170*  --   --  240* 183*  BUN   < >  --  24*  --  27*  --  27*  --   --  37* 33*  CREATININE   < >  --  0.55*  --  0.71  --  0.68  --   --  0.81 0.67  CALCIUM   < >  --  7.9*  --  8.0*  --  7.9*  --   --  8.1* 7.9*  MG  --  2.0 2.1  --  2.3  --  2.2  --  2.4  --   --    < > = values in this interval not displayed.   Liver Function Tests: Recent Labs  Lab 04/03/20 0422 04/04/20 0338 04/05/20 0408 04/07/20 0830 04/08/20 0517  AST 47* 61* 50* 33 24  ALT 30 47* 58* 48* 44  ALKPHOS 31* 38 45 49 46  BILITOT 0.7 1.0 1.0 1.0 1.2  PROT 5.9* 6.0* 5.7* 5.9* 5.3*  ALBUMIN 2.5* 2.6* 2.4* 2.4* 2.1*   No results for input(s): LIPASE, AMYLASE in the last 168 hours. No results for input(s): AMMONIA in the last 168 hours. CBC: Recent Labs  Lab 04/04/20 0338 04/05/20 0408 04/06/20 0439 04/07/20 0830 04/08/20 0517  WBC 8.9 10.9* 14.4* 17.4* 12.5*  NEUTROABS 7.7 9.7* 13.1* 16.4* 11.7*  HGB 13.2 13.1  13.4 13.8 12.8*  HCT 38.7* 38.0* 39.4 40.1 35.8*  MCV 88.6 88.2 88.9 86.4 85.9  PLT 222 245 272 271 251   Cardiac Enzymes: No results for input(s): CKTOTAL, CKMB, CKMBINDEX, TROPONINI in the last 168 hours. BNP: Invalid input(s): POCBNP CBG: Recent Labs  Lab 04/07/20 1647 04/07/20 2134 04/08/20 0744 04/08/20 1153 04/08/20 1608  GLUCAP 211* 345* 148* 177* 203*       IMAGING RESULTS:  Imaging: No results found.   ASSESSMENT AND PLAN    -Multidisciplinary rounds held today  Acute Hypoxic Respiratory Failure Acute COVID19 pneumonia -Remdesevir antiviral - pharmacy protocol 5 d -vitamin C -zinc -decadron 6mg  IV daily  -Diuresis - Lasix 40 IV daily - monitor UOP - utilize external urinary catheter if possible -Self prone if patient can tolerate  -encourage to use IS and Acapella device for bronchopulmonary hygiene when able -d/c hepatotoxic medications while on remdesevir -supportive care with ICU telemetry monitoring -PT/OT when possible -procalcitonin, CRP and ferritin trending    Atrial fibrillation  -on eliquis and cardizem -rate controlled  ID -continue IV abx as prescibed -follow up cultures  GI/Nutrition GI PROPHYLAXIS as indicated DIET-->TF's as tolerated Constipation protocol as indicated  ENDO - ICU hypoglycemic\Hyperglycemia protocol -check FSBS per protocol   ELECTROLYTES -follow labs as needed -replace as needed -pharmacy consultation   DVT/GI PRX ordered -SCDs  TRANSFUSIONS AS NEEDED MONITOR FSBS ASSESS the need for LABS as needed   Critical care provider statement:    Critical care time (minutes):  33   Critical care time was exclusive of:  Separately billable procedures and treating other patients   Critical care was necessary to treat or prevent imminent or life-threatening deterioration of the following conditions:  acute hypoxemic respiratory failure due to COVID19   Critical care was time spent personally by me on  the following activities:  Development of treatment plan with patient or surrogate, discussions with consultants, evaluation of patient's response to treatment, examination of patient, obtaining history from patient or surrogate, ordering and performing treatments and interventions, ordering and review of laboratory studies and re-evaluation of patient's condition.  I assumed direction of critical care for this patient from another provider in my specialty: no    This document was prepared using Dragon voice recognition software and may include unintentional dictation errors.    Vida Rigger, M.D.  Division of Pulmonary & Critical Care Medicine  [facility]

## 2020-04-08 NOTE — Progress Notes (Signed)
Johnny Shepard, Johnny Shepard, Johnny Shepard   Brief Narrative:  69 year old man PMH atrial fibrillation presented with shortness of breath, recent outpatient diagnosis with Covid.  Admitted for acute hypoxic respiratory failure secondary to Covid multifocal pneumonia.  8/25: Shepard seen and examined.  Lying prone in bed.  Still on 50 L high flow in addition nonrebreather.  Saturations in the 90s on above oxygen.  Mentating clearly.  Normal work of breathing.  8/26: Shepard seen and examined.  Clinically appears better today.  Remains on 50 L high flow nasal cannula and nonrebreather.  Saturations mid 90s.  Mentating clearly.  Normal work of breathing.  8/27: Shepard seen and examined.  Continues to subjectively improve on my evaluation.  Sitting up in bed.  Eating.  Speaking in complete sentences.  Remains on 50 L high flow nasal cannula in addition to nonrebreather.  Saturations been 90.  Mentating very clearly.  Normal work of breathing.  8/28: Shepard seen and examined.  Sitting up in bed.  Mentating clearly.  Normal work of breathing.  Was able to remove the nonrebreather without any negative effects on his saturations.  Now just on 50 L heated high flow nasal cannula.  8/29: Shepard seen and examined. Sitting up in chair. Feels well overall. Mentating clearly. Normal work of breathing. Remains on 50 L heated high flow nasal cannula. Was able to make it through the night without a nonrebreather.   Assessment & Plan:   Principal Problem:   Acute hypoxemic respiratory failure due to COVID-19 [facility]) Active Problems:   Pneumonia due to COVID-19 virus   Unspecified atrial fibrillation (HCC)   Thrombocytopenia (HCC)   Elevated transaminase level  Acute hypoxemic respiratory failure due to COVID-19 [facility]) --appears about the same today, remains on HHF 50L, NRB w/ SpO2 in high 80s --8/23 CXR Minimal patchy pulmonary infiltrate  may be present at the left lung base and within the right mid lung zone --oxygen HHF 50L --inflammatory markers  Ferritin 1605 > 1551 > 1558>1456>1023  CRP 5.9 > 4.8 > 2.5 >1.1>0.7  Fibrin derivatives 935 > 819 > 1437 >2608>7500  CTA could not be performed.  Lower extremity Doppler negative for PE --Tx  Remdesivir 8/22 > 04/05/20  Steroids 8/22 >   Baricitinib 8/23 >  Prone  Mobility as tolerated Lasix 40 mg IV daily.  Monitor kidney function while on diuresis  Unspecified atrial fibrillation (HCC) S/p Watchman procedure at [facility] --stable; continue apixaban, diltiazem  Pneumonia due to COVID-19 virus --plan as above  Thrombocytopenia (HCC) --secondary to acute illness, resolved.  Elevated transaminase level --AST slightly decreased. Will monitor w/ daily CMP   DVT prophylaxis: Eliquis Code Status: Full Family Communication: Wife via phone [phone] on 8/29/Johnny Disposition Plan:Status is: Inpatient  Remains inpatient appropriate because:Inpatient level of care appropriate due to severity of illness   Dispo: The Shepard is from: Home              Anticipated d/c is to: Home              Anticipated d/c date is: > 3 days              Shepard currently is not medically stable to d/c.   Still with severe acute hypoxic respiratory failure secondary to COVID-19 pneumonia.  On 50 L heated high flow nasal cannula.  Attempting daily diuresis.  Clinically feels well.   Consultants:  None  Procedures:   None  Antimicrobials:   Remdesivir   Subjective: Shepard seen and examined.  Endorses some shortness of breath but improving over interval.  Remains on 50 L heated high flow nasal cannula.  Objective: Vitals:   04/08/20 1100 04/08/20 1200 04/08/20 1300 04/08/20 1400  BP:   138/88 116/80  Pulse: 80  (!) 105 96  Resp: 19  (!) 25 (!) 24  Temp:  98 F (36.7 C)    TempSrc:  Axillary    SpO2: (!) 84%  94% (!) 86%  Weight:      Height:         Intake/Output Summary (Last 24 hours) at 8/29/Johnny 1420 Last data filed at 8/29/Johnny 1152 Gross Shepard 24 hour  Intake 640 ml  Output 1550 ml  Net -910 ml   Filed Weights   04-07-20 1852 04/02/20 0915  Weight: 79 kg 84.7 kg    Examination:  General: Johnny apparent distress, Shepard appears well HEENT: Normocephalic, atraumatic Neck, supple, trachea midline, Johnny tenderness Heart: Regular rate and rhythm, S1/S2 normal, Johnny murmurs Lungs: Scattered bibasilar crackles.  Normal work of breathing.  50 L HHFNC Abdomen: Soft, nontender, nondistended, positive bowel sounds Extremities: Normal, atraumatic, Johnny clubbing or cyanosis, normal muscle tone Skin: Johnny rashes or lesions, normal color Neurologic: Cranial nerves grossly intact, sensation intact, alert and oriented x3 Psychiatric: Normal affect      Data Reviewed: I have personally reviewed following labs and imaging studies  CBC: Recent Labs  Lab 04/04/20 0338 04/05/20 0408 04/06/20 0439 04/07/20 0830 04/08/20 0517  WBC 8.9 10.9* 14.4* 17.4* 12.5*  NEUTROABS 7.7 9.7* 13.1* 16.4* 11.7*  HGB 13.2 13.1 13.4 13.8 12.8*  HCT 38.7* 38.0* 39.4 40.1 35.8*  MCV 88.6 88.2 88.9 86.4 85.9  PLT 222 245 272 271 251   Basic Metabolic Panel: Recent Labs  Lab Johnny/08/28 1912 04/02/20 0511 04/03/20 0422 04/04/20 0338 04/05/20 0408 04/06/20 0439 04/07/20 0830 04/08/20 0517  NA   < >  --  136 133* 137  --  135 134*  K   < >  --  3.9 4.2 4.1  --  4.4 3.9  CL   < >  --  102 100 101  --  92* 94*  CO2   < >  --  23 24 26   --  32 34*  GLUCOSE   < >  --  141* 146* 170*  --  240* 183*  BUN   < >  --  24* 27* 27*  --  37* 33*  CREATININE   < >  --  0.55* 0.71 0.68  --  0.81 0.67  CALCIUM   < >  --  7.9* 8.0* 7.9*  --  8.1* 7.9*  MG  --  2.0 2.1 2.3 2.2 2.4  --   --    < > = values in this interval not displayed.   GFR: Estimated Creatinine Clearance: 95.7 mL/min (by C-G formula based on SCr of 0.67 mg/dL). Liver Function  Tests: Recent Labs  Lab 04/03/20 0422 04/04/20 0338 04/05/20 0408 04/07/20 0830 04/08/20 0517  AST 47* 61* 50* 33 24  ALT 30 47* 58* 48* 44  ALKPHOS 31* 38 45 49 46  BILITOT 0.7 1.0 1.0 1.0 1.2  PROT 5.9* 6.0* 5.7* 5.9* 5.3*  ALBUMIN 2.5* 2.6* 2.4* 2.4* 2.1*   Johnny results for input(s): LIPASE, AMYLASE in the last 168 hours. Johnny results for input(s): AMMONIA in the last 168 hours.  Coagulation Profile: Johnny results for input(s): INR, PROTIME in the last 168 hours. Cardiac Enzymes: Johnny results for input(s): CKTOTAL, CKMB, CKMBINDEX, TROPONINI in the last 168 hours. BNP (last 3 results) Johnny results for input(s): PROBNP in the last 8760 hours. HbA1C: Johnny results for input(s): HGBA1C in the last 72 hours. CBG: Recent Labs  Lab 04/07/20 1139 04/07/20 1647 04/07/20 2134 04/08/20 0744 04/08/20 1153  GLUCAP 229* 211* 345* 148* 177*   Lipid Profile: Johnny results for input(s): CHOL, HDL, LDLCALC, TRIG, CHOLHDL, LDLDIRECT in the last 72 hours. Thyroid Function Tests: Johnny results for input(s): TSH, T4TOTAL, FREET4, T3FREE, THYROIDAB in the last 72 hours. Anemia Panel: Recent Labs    04/06/20 0439  FERRITIN 1,023*   Sepsis Labs: Recent Labs  Lab 08-31-Johnny 1912 04/10/20 2120  PROCALCITON <0.10 <0.10    Recent Results (from the past 240 hour(s))  SARS Coronavirus 2 by RT PCR (hospital order, performed in [facility] hospital lab) Nasopharyngeal Nasopharyngeal Swab     Status: Abnormal   Collection Time: 04/10/20  7:53 PM   Specimen: Nasopharyngeal Swab  Result Value Ref Range Status   SARS Coronavirus 2 POSITIVE (A) NEGATIVE Final    Comment: RESULT CALLED TO, READ BACK BY AND VERIFIED WITH: LEA FERGUISON August 31, Johnny AT 2115 BY AR (NOTE) SARS-CoV-2 target nucleic acids are DETECTED  SARS-CoV-2 RNA is generally detectable in upper respiratory specimens  during the acute phase of infection.  Positive results are indicative  of the presence of the identified virus, but do not rule  out bacterial infection or co-infection with other pathogens not detected by the test.  Clinical correlation with Shepard history and  other diagnostic information is necessary to determine Shepard infection status.  The expected result is negative.  Fact Sheet for Patients:   BoilerBrush.com.cy   Fact Sheet for Healthcare Providers:   https://pope.com/    This test is not yet approved or cleared by the [place] FDA and  has been authorized for detection and/or diagnosis of SARS-CoV-2 by FDA under an Emergency Use Authorization (EUA).  This EUA will remain in effect (meaning this t est can be used) for the duration of  the COVID-19 declaration under Section 564(b)(1) of the Act, 21 U.S.C. section 360-bbb-3(b)(1), unless the authorization is terminated or revoked sooner.  Performed at [facility], [address] [place], [place] [address]   Culture, blood (Routine X 2) w Reflex to ID Panel     Status: None   Collection Time: 08/31/Johnny  9:20 PM   Specimen: BLOOD  Result Value Ref Range Status   Specimen Description BLOOD BLOOD RIGHT FOREARM  Final   Special Requests   Final    BOTTLES DRAWN AEROBIC AND ANAEROBIC Blood Culture adequate volume   Culture   Final    Johnny GROWTH 5 DAYS Performed at [facility], [address] [place], [place] [address]    Report Status 08/27/Johnny FINAL  Final  Culture, blood (Routine X 2) w Reflex to ID Panel     Status: None   Collection Time: Johnny/08/31  9:31 PM   Specimen: BLOOD  Result Value Ref Range Status   Specimen Description BLOOD RIGHT ANTECUBITAL  Final   Special Requests   Final    BOTTLES DRAWN AEROBIC AND ANAEROBIC Blood Culture results may not be optimal due to an excessive volume of blood received in culture bottles   Culture   Final    Johnny GROWTH 5 DAYS Performed at [facility],  [address] [place], [place] [address]    Report Status  08/27/Johnny FINAL  Final  MRSA PCR Screening     Status: None   Collection Time: 04/02/20 10:00 AM   Specimen: Nasal Mucosa; Nasopharyngeal  Result Value Ref Range Status   MRSA by PCR NEGATIVE NEGATIVE Final    Comment:        The GeneXpert MRSA Assay (FDA approved for NASAL specimens only), is one component of a comprehensive MRSA colonization surveillance program. It is not intended to diagnose MRSA infection nor to guide or monitor treatment for MRSA infections. Performed at [facility], [address] [place], [place] [address]          Radiology Studies: Johnny results found.      Scheduled Meds: . apixaban  5 mg Oral BID  . vitamin C  500 mg Oral Daily  . aspirin EC  325 mg Oral Daily  . baricitinib  4 mg Oral Daily  . chlorhexidine  15 mL Mouth Rinse BID  . Chlorhexidine Gluconate Cloth  6 each Topical Daily  . cholecalciferol  1,000 Units Oral Daily  . diltiazem  180 mg Oral Q12H  . famotidine  20 mg Oral BID  . feeding supplement (ENSURE ENLIVE)  237 mL Oral TID BM  . guaiFENesin  600 mg Oral BID  . insulin aspart  0-5 Units Subcutaneous QHS  . insulin aspart  0-6 Units Subcutaneous TID WC  . mouth rinse  15 mL Mouth Rinse q12n4p  . methylPREDNISolone (SOLU-MEDROL) injection  40 mg Intravenous Q12H  . zinc sulfate  220 mg Oral Daily   Continuous Infusions:    LOS: 7 days    Time spent: 25 minutes    Tresa MooreSudheer B Kastin Cerda, MD [facility] Pager [phone]  If 7PM-7AM, please contact night-coverage 8/29/Johnny, 2:20 PM

## 2020-04-08 NOTE — Progress Notes (Signed)
Assisted tele visit to patient with family member.  Donivan Thammavong M, RN  

## 2020-04-09 LAB — GLUCOSE, CAPILLARY
Glucose-Capillary: 152 mg/dL — ABNORMAL HIGH (ref 70–99)
Glucose-Capillary: 168 mg/dL — ABNORMAL HIGH (ref 70–99)
Glucose-Capillary: 188 mg/dL — ABNORMAL HIGH (ref 70–99)
Glucose-Capillary: 241 mg/dL — ABNORMAL HIGH (ref 70–99)
Glucose-Capillary: 262 mg/dL — ABNORMAL HIGH (ref 70–99)

## 2020-04-09 LAB — CBC WITH DIFFERENTIAL/PLATELET
Abs Immature Granulocytes: 0.15 10*3/uL — ABNORMAL HIGH (ref 0.00–0.07)
Basophils Absolute: 0 10*3/uL (ref 0.0–0.1)
Basophils Relative: 0 %
Eosinophils Absolute: 0 10*3/uL (ref 0.0–0.5)
Eosinophils Relative: 0 %
HCT: 36.5 % — ABNORMAL LOW (ref 39.0–52.0)
Hemoglobin: 12.5 g/dL — ABNORMAL LOW (ref 13.0–17.0)
Immature Granulocytes: 1 %
Lymphocytes Relative: 2 %
Lymphs Abs: 0.3 10*3/uL — ABNORMAL LOW (ref 0.7–4.0)
MCH: 30 pg (ref 26.0–34.0)
MCHC: 34.2 g/dL (ref 30.0–36.0)
MCV: 87.7 fL (ref 80.0–100.0)
Monocytes Absolute: 0.3 10*3/uL (ref 0.1–1.0)
Monocytes Relative: 3 %
Neutro Abs: 12.5 10*3/uL — ABNORMAL HIGH (ref 1.7–7.7)
Neutrophils Relative %: 94 %
Platelets: 233 10*3/uL (ref 150–400)
RBC: 4.16 MIL/uL — ABNORMAL LOW (ref 4.22–5.81)
RDW: 13.4 % (ref 11.5–15.5)
WBC: 13.3 10*3/uL — ABNORMAL HIGH (ref 4.0–10.5)
nRBC: 0 % (ref 0.0–0.2)

## 2020-04-09 LAB — COMPREHENSIVE METABOLIC PANEL
ALT: 44 U/L (ref 0–44)
AST: 30 U/L (ref 15–41)
Albumin: 2.1 g/dL — ABNORMAL LOW (ref 3.5–5.0)
Alkaline Phosphatase: 52 U/L (ref 38–126)
Anion gap: 8 (ref 5–15)
BUN: 32 mg/dL — ABNORMAL HIGH (ref 8–23)
CO2: 31 mmol/L (ref 22–32)
Calcium: 8.1 mg/dL — ABNORMAL LOW (ref 8.9–10.3)
Chloride: 96 mmol/L — ABNORMAL LOW (ref 98–111)
Creatinine, Ser: 0.75 mg/dL (ref 0.61–1.24)
GFR calc Af Amer: >60 mL/min (ref >60)
GFR calc non Af Amer: >60 mL/min (ref >60)
Glucose, Bld: 155 mg/dL — ABNORMAL HIGH (ref 70–99)
Potassium: 5 mmol/L (ref 3.5–5.1)
Sodium: 135 mmol/L (ref 135–145)
Total Bilirubin: 1.1 mg/dL (ref 0.3–1.2)
Total Protein: 5.3 g/dL — ABNORMAL LOW (ref 6.5–8.1)

## 2020-04-09 MED ORDER — FUROSEMIDE 10 MG/ML IJ SOLN
40.0000 mg | Freq: Every day | INTRAMUSCULAR | Status: DC
  Administered 2020-04-09 – 2020-04-10 (×2): 40 mg via INTRAVENOUS
  Filled 2020-04-09 (×2): qty 4

## 2020-04-09 NOTE — Progress Notes (Addendum)
PROGRESS NOTE    Johnny Shepard  ZYS:[id number] DOB: November 24, 1950 DOA: 29-Apr-2020 PCP: Patient, No Pcp Per   Brief Narrative:  69 year old man PMH atrial fibrillation presented with shortness of breath, recent outpatient diagnosis with Covid.  Admitted for acute hypoxic respiratory failure secondary to Covid multifocal pneumonia.  8/25: Patient seen and examined.  Lying prone in bed.  Still on 50 L high flow in addition nonrebreather.  Saturations in the 90s on above oxygen.  Mentating clearly.  Normal work of breathing.  8/26: Patient seen and examined.  Clinically appears better today.  Remains on 50 L high flow nasal cannula and nonrebreather.  Saturations mid 90s.  Mentating clearly.  Normal work of breathing.  8/27: Patient seen and examined.  Continues to subjectively improve on my evaluation.  Sitting up in bed.  Eating.  Speaking in complete sentences.  Remains on 50 L high flow nasal cannula in addition to nonrebreather.  Saturations been 90.  Mentating very clearly.  Normal work of breathing.  8/28: Patient seen and examined.  Sitting up in bed.  Mentating clearly.  Normal work of breathing.  Was able to remove the nonrebreather without any negative effects on his saturations.  Now just on 50 L heated high flow nasal cannula.  8/29: Patient seen and examined. Sitting up in chair. Feels well overall. Mentating clearly. Normal work of breathing. Remains on 50 L heated high flow nasal cannula. Was able to make it through the night without a nonrebreather.  8/30: Patient seen and examined.  Sitting up in chair.  Clinically appears well.  Little fatigued.  Remains dependent on 50 L heated high flow nasal cannula.     Assessment & Plan:   Principal Problem:   Acute hypoxemic respiratory failure due to COVID-19 [facility]) Active Problems:   Pneumonia due to COVID-19 virus   Unspecified atrial fibrillation (HCC)   Thrombocytopenia (HCC)   Elevated transaminase level  Acute hypoxemic  respiratory failure due to COVID-19 [facility]) --appears about the same today, remains on HHF 50L, NRB w/ SpO2 in high 80s --8/23 CXR Minimal patchy pulmonary infiltrate may be present at the left lung base and within the right mid lung zone --oxygen HHF 50L --inflammatory markers  Ferritin 1605 > 1551 > 1558>1456>1023  CRP 5.9 > 4.8 > 2.5 >1.1>0.7  Fibrin derivatives 935 > 819 > 1437 >2608>7500  CTA could not be performed.  Lower extremity Doppler negative for PE Plan:  Remdesivir 8/22 > 04/05/20  Steroids 8/22 >   Baricitinib 8/23 >  Prone  Mobility as tolerated Lasix 40 mg IV daily.  Monitor kidney function while on diuresis Status post ivermectin 1 dose 04/08/20  Unspecified atrial fibrillation (HCC) S/p Watchman procedure at [facility] --stable; continue apixaban, diltiazem  Pneumonia due to COVID-19 virus --plan as above  Thrombocytopenia (HCC) --secondary to acute illness, resolved.  Elevated transaminase level --AST slightly decreased. Will monitor w/ daily CMP   DVT prophylaxis: Eliquis Code Status: Full Family Communication: Wife via phone [phone] on 04/09/2020 Disposition Plan:Status is: Inpatient  Remains inpatient appropriate because:Inpatient level of care appropriate due to severity of illness   Dispo: The patient is from: Home              Anticipated d/c is to: Home              Anticipated d/c date is: > 3 days              Patient currently is not medically stable  to d/c.   Patient remains critically ill.  Severe acute hypoxic respiratory failure secondary to COVID-19 pneumonia.  Clinically feels well.  Motivated to improve..  Adherent to proning and incentive spirometry.  Prognosis guarded   Consultants:   None  Procedures:   None  Antimicrobials:   Remdesivir   Subjective: Patient seen and examined.  Nurses some shortness of breath.  Speaking in complete sentences.  Remains on 50L HHFNC  Objective: Vitals:   04/09/20 0839  04/09/20 0845 04/09/20 1000 04/09/20 1100  BP: 115/83  91/72 107/76  Pulse:   75 68  Resp:   19 (!) 23  Temp:      TempSrc:      SpO2:  94% 95% 94%  Weight:      Height:        Intake/Output Summary (Last 24 hours) at 04/09/2020 1347 Last data filed at 04/09/2020 0530 Gross per 24 hour  Intake 480 ml  Output 1725 ml  Net -1245 ml   Filed Weights   2020-04-05 1852 04/02/20 0915  Weight: 79 kg 84.7 kg    Examination:  General: Appears fatigued HEENT: Normocephalic, atraumatic Neck, supple, trachea midline, no tenderness Heart: Regular rate and rhythm, S1/S2 normal, no murmurs Lungs: Bibasilar crackles.  No wheezes.  Normal work of breathing.  50 L HHFNC Abdomen: Soft, nontender, nondistended, positive bowel sounds Extremities: Normal, atraumatic, no clubbing or cyanosis, normal muscle tone Skin: No rashes or lesions, normal color Neurologic: Cranial nerves grossly intact, sensation intact, alert and oriented x3 Psychiatric: Normal affect       Data Reviewed: I have personally reviewed following labs and imaging studies  CBC: Recent Labs  Lab 04/05/20 0408 04/06/20 0439 04/07/20 0830 04/08/20 0517 04/09/20 0409  WBC 10.9* 14.4* 17.4* 12.5* 13.3*  NEUTROABS 9.7* 13.1* 16.4* 11.7* 12.5*  HGB 13.1 13.4 13.8 12.8* 12.5*  HCT 38.0* 39.4 40.1 35.8* 36.5*  MCV 88.2 88.9 86.4 85.9 87.7  PLT 245 272 271 251 233   Basic Metabolic Panel: Recent Labs  Lab 04/03/20 0422 04/03/20 0422 04/04/20 0338 04/05/20 0408 04/06/20 0439 04/07/20 0830 04/08/20 0517 04/09/20 0409  NA 136   < > 133* 137  --  135 134* 135  K 3.9   < > 4.2 4.1  --  4.4 3.9 5.0  CL 102   < > 100 101  --  92* 94* 96*  CO2 23   < > 24 26  --  32 34* 31  GLUCOSE 141*   < > 146* 170*  --  240* 183* 155*  BUN 24*   < > 27* 27*  --  37* 33* 32*  CREATININE 0.55*   < > 0.71 0.68  --  0.81 0.67 0.75  CALCIUM 7.9*   < > 8.0* 7.9*  --  8.1* 7.9* 8.1*  MG 2.1  --  2.3 2.2 2.4  --   --   --    < > =  values in this interval not displayed.   GFR: Estimated Creatinine Clearance: 95.7 mL/min (by C-G formula based on SCr of 0.75 mg/dL). Liver Function Tests: Recent Labs  Lab 04/04/20 0338 04/05/20 0408 04/07/20 0830 04/08/20 0517 04/09/20 0409  AST 61* 50* 33 24 30  ALT 47* 58* 48* 44 44  ALKPHOS 38 45 49 46 52  BILITOT 1.0 1.0 1.0 1.2 1.1  PROT 6.0* 5.7* 5.9* 5.3* 5.3*  ALBUMIN 2.6* 2.4* 2.4* 2.1* 2.1*   No results for input(s): LIPASE, AMYLASE  in the last 168 hours. No results for input(s): AMMONIA in the last 168 hours. Coagulation Profile: No results for input(s): INR, PROTIME in the last 168 hours. Cardiac Enzymes: No results for input(s): CKTOTAL, CKMB, CKMBINDEX, TROPONINI in the last 168 hours. BNP (last 3 results) No results for input(s): PROBNP in the last 8760 hours. HbA1C: No results for input(s): HGBA1C in the last 72 hours. CBG: Recent Labs  Lab 04/08/20 1153 04/08/20 1608 04/08/20 2138 04/09/20 0730 04/09/20 1104  GLUCAP 177* 203* 203* 152* 241*   Lipid Profile: No results for input(s): CHOL, HDL, LDLCALC, TRIG, CHOLHDL, LDLDIRECT in the last 72 hours. Thyroid Function Tests: No results for input(s): TSH, T4TOTAL, FREET4, T3FREE, THYROIDAB in the last 72 hours. Anemia Panel: No results for input(s): VITAMINB12, FOLATE, FERRITIN, TIBC, IRON, RETICCTPCT in the last 72 hours. Sepsis Labs: No results for input(s): PROCALCITON, LATICACIDVEN in the last 168 hours.  Recent Results (from the past 240 hour(s))  SARS Coronavirus 2 by RT PCR (hospital order, performed in [facility] hospital lab) Nasopharyngeal Nasopharyngeal Swab     Status: Abnormal   Collection Time: 04/21/20  7:53 PM   Specimen: Nasopharyngeal Swab  Result Value Ref Range Status   SARS Coronavirus 2 POSITIVE (A) NEGATIVE Final    Comment: RESULT CALLED TO, READ BACK BY AND VERIFIED WITH: LEA FERGUISON 2020/04/21 AT 2115 BY AR (NOTE) SARS-CoV-2 target nucleic acids are  DETECTED  SARS-CoV-2 RNA is generally detectable in upper respiratory specimens  during the acute phase of infection.  Positive results are indicative  of the presence of the identified virus, but do not rule out bacterial infection or co-infection with other pathogens not detected by the test.  Clinical correlation with patient history and  other diagnostic information is necessary to determine patient infection status.  The expected result is negative.  Fact Sheet for Patients:   BoilerBrush.com.cy   Fact Sheet for Healthcare Providers:   https://pope.com/    This test is not yet approved or cleared by the [place] FDA and  has been authorized for detection and/or diagnosis of SARS-CoV-2 by FDA under an Emergency Use Authorization (EUA).  This EUA will remain in effect (meaning this t est can be used) for the duration of  the COVID-19 declaration under Section 564(b)(1) of the Act, 21 U.S.C. section 360-bbb-3(b)(1), unless the authorization is terminated or revoked sooner.  Performed at [facility], [address] [place], [place] [address]   Culture, blood (Routine X 2) w Reflex to ID Panel     Status: None   Collection Time: April 21, 2020  9:20 PM   Specimen: BLOOD  Result Value Ref Range Status   Specimen Description BLOOD BLOOD RIGHT FOREARM  Final   Special Requests   Final    BOTTLES DRAWN AEROBIC AND ANAEROBIC Blood Culture adequate volume   Culture   Final    NO GROWTH 5 DAYS Performed at [facility], [address] [place], [place] [address]    Report Status 04/06/2020 FINAL  Final  Culture, blood (Routine X 2) w Reflex to ID Panel     Status: None   Collection Time: 2020-04-21  9:31 PM   Specimen: BLOOD  Result Value Ref Range Status   Specimen Description BLOOD RIGHT ANTECUBITAL  Final   Special Requests   Final    BOTTLES DRAWN AEROBIC AND ANAEROBIC Blood Culture results may not be optimal  due to an excessive volume of blood received in culture bottles  Culture   Final    NO GROWTH 5 DAYS Performed at [facility], [address] [place], [place] [address]    Report Status 04/06/2020 FINAL  Final  MRSA PCR Screening     Status: None   Collection Time: 04/02/20 10:00 AM   Specimen: Nasal Mucosa; Nasopharyngeal  Result Value Ref Range Status   MRSA by PCR NEGATIVE NEGATIVE Final    Comment:        The GeneXpert MRSA Assay (FDA approved for NASAL specimens only), is one component of a comprehensive MRSA colonization surveillance program. It is not intended to diagnose MRSA infection nor to guide or monitor treatment for MRSA infections. Performed at [facility], [address] [place], [place] [address]          Radiology Studies: No results found.      Scheduled Meds:  apixaban  5 mg Oral BID   vitamin C  500 mg Oral Daily   aspirin EC  325 mg Oral Daily   baricitinib  4 mg Oral Daily   chlorhexidine  15 mL Mouth Rinse BID   Chlorhexidine Gluconate Cloth  6 each Topical Daily   cholecalciferol  1,000 Units Oral Daily   diltiazem  180 mg Oral Q12H   famotidine  20 mg Oral BID   feeding supplement (ENSURE ENLIVE)  237 mL Oral TID BM   guaiFENesin  600 mg Oral BID   insulin aspart  0-5 Units Subcutaneous QHS   insulin aspart  0-6 Units Subcutaneous TID WC   mouth rinse  15 mL Mouth Rinse q12n4p   methylPREDNISolone (SOLU-MEDROL) injection  40 mg Intravenous Q12H   zinc sulfate  220 mg Oral Daily   Continuous Infusions:    LOS: 8 days    Time spent: 25 minutes    Tresa MooreSudheer B Valkyrie Guardiola, MD [facility] Pager [phone]  If 7PM-7AM, please contact night-coverage 04/09/2020, 1:47 PM

## 2020-04-09 NOTE — Progress Notes (Signed)
Follow up - Critical Care Medicine Note  Patient Details:    Johnny Shepard is an 69 y.o. male withPMH atrial fibrillation presented with shortness of breath, recent outpatient diagnosis with Covid. Admitted for acute hypoxic respiratory failure secondary to Covid multifocal pneumonia.   Lines, Airways, Drains:    Anti-infectives:  Anti-infectives (From admission, onward)   Start     Dose/Rate Route Frequency Ordered Stop   04/08/20 0930  ivermectin (STROMECTOL) tablet 12,000 mcg        150 mcg/kg  84.7 kg Oral  Once 04/08/20 0844 04/08/20 1246   04/02/20 1000  remdesivir 100 mg in sodium chloride 0.9 % 100 mL IVPB       "Followed by" Linked Group Details   100 mg 200 mL/hr over 30 Minutes Intravenous Daily 03/18/2020 2025 04/05/20 1947   03/22/2020 2200  remdesivir 200 mg in sodium chloride 0.9% 250 mL IVPB       "Followed by" Linked Group Details   200 mg 580 mL/hr over 30 Minutes Intravenous Once 03/13/2020 2025 04/02/20 0015      Microbiology: Results for orders placed or performed during the hospital encounter of 03/15/2020  SARS Coronavirus 2 by RT PCR (hospital order, performed in [facility] hospital lab) Nasopharyngeal Nasopharyngeal Swab     Status: Abnormal   Collection Time: 03/31/2020  7:53 PM   Specimen: Nasopharyngeal Swab  Result Value Ref Range Status   SARS Coronavirus 2 POSITIVE (A) NEGATIVE Final    Comment: RESULT CALLED TO, READ BACK BY AND VERIFIED WITH: LEA FERGUISON 04/07/2020 AT 2115 BY AR (NOTE) SARS-CoV-2 target nucleic acids are DETECTED  SARS-CoV-2 RNA is generally detectable in upper respiratory specimens  during the acute phase of infection.  Positive results are indicative  of the presence of the identified virus, but do not rule out bacterial infection or co-infection with other pathogens not detected by the test.  Clinical correlation with patient history and  other diagnostic information is necessary to determine patient infection status.  The  expected result is negative.  Fact Sheet for Patients:   BoilerBrush.com.cy   Fact Sheet for Healthcare Providers:   https://pope.com/    This test is not yet approved or cleared by the [place] FDA and  has been authorized for detection and/or diagnosis of SARS-CoV-2 by FDA under an Emergency Use Authorization (EUA).  This EUA will remain in effect (meaning this t est can be used) for the duration of  the COVID-19 declaration under Section 564(b)(1) of the Act, 21 U.S.C. section 360-bbb-3(b)(1), unless the authorization is terminated or revoked sooner.  Performed at [facility], [address] [place], [place] [address]   Culture, blood (Routine X 2) w Reflex to ID Panel     Status: None   Collection Time: 03/14/2020  9:20 PM   Specimen: BLOOD  Result Value Ref Range Status   Specimen Description BLOOD BLOOD RIGHT FOREARM  Final   Special Requests   Final    BOTTLES DRAWN AEROBIC AND ANAEROBIC Blood Culture adequate volume   Culture   Final    NO GROWTH 5 DAYS Performed at [facility], [address], [place], [place] [address]    Report Status 04/06/2020 FINAL  Final  Culture, blood (Routine X 2) w Reflex to ID Panel     Status: None   Collection Time: 03/11/2020  9:31 PM   Specimen: BLOOD  Result Value Ref Range Status   Specimen Description BLOOD RIGHT ANTECUBITAL  Final   Special Requests   Final    BOTTLES DRAWN AEROBIC AND ANAEROBIC Blood Culture results may not be optimal due to an excessive volume of blood received in culture bottles   Culture   Final    NO GROWTH 5 DAYS Performed at [facility], [address] [place], [place] [address]    Report Status 04/06/2020 FINAL  Final  MRSA PCR Screening     Status: None   Collection Time: 04/02/20 10:00 AM   Specimen: Nasal Mucosa; Nasopharyngeal  Result Value Ref Range Status   MRSA by PCR NEGATIVE NEGATIVE Final    Comment:         The GeneXpert MRSA Assay (FDA approved for NASAL specimens only), is one component of a comprehensive MRSA colonization surveillance program. It is not intended to diagnose MRSA infection nor to guide or monitor treatment for MRSA infections. Performed at [facility], [address] [place] [address] [place], [place] [address]     Best Practice/Protocols:  VTE Prophylaxis: Direct Thrombin Inhibitor GI Prophylaxis: Antihistamine   Events: 04/07/20- no overnight events patient verbalizes in full sentences, relates clinical improvement.  04/08/20- patient in good spirits , he is still on elevated settings with HFNC.Encourage use of IS. 8/30- Requiring HFNC + NRB,no conversational dyspnea.  Studies: US Venous Img Lower Bilateral (DVT)  Result Date: 04/05/2020 CLINICAL DATA:  Bilateral lower extremity pain and edema. Elevated D-dimer. Patient is currently on anticoagulation. Evaluate for DVT. EXAM: BILATERAL LOWER EXTREMITY VENOUS DOPPLER ULTRASOUND TECHNIQUE: Gray-scale sonography with graded compression, as well as color Doppler and duplex ultrasound were performed to evaluate the lower extremity deep venous systems from the level of the common femoral vein and including the common femoral, femoral, profunda femoral, popliteal and calf veins including the posterior tibial, peroneal and gastrocnemius veins when visible. The superficial great saphenous vein was also interrogated. Spectral Doppler was utilized to evaluate flow at rest and with distal augmentation maneuvers in the common femoral, femoral and popliteal veins. COMPARISON:  None. FINDINGS: RIGHT LOWER EXTREMITY Common Femoral Vein: No evidence of thrombus. Normal compressibility, respiratory phasicity and response to augmentation. Saphenofemoral Junction: No evidence of thrombus. Normal compressibility and flow on color Doppler imaging. Profunda Femoral Vein: No evidence of thrombus. Normal compressibility and flow on color Doppler  imaging. Femoral Vein: No evidence of thrombus. Normal compressibility, respiratory phasicity and response to augmentation. Popliteal Vein: No evidence of thrombus. Normal compressibility, respiratory phasicity and response to augmentation. Calf Veins: No evidence of thrombus. Normal compressibility and flow on color Doppler imaging. Superficial Great Saphenous Vein: No evidence of thrombus. Normal compressibility. Venous Reflux:  None. Other Findings:  None. LEFT LOWER EXTREMITY Common Femoral Vein: No evidence of thrombus. Normal compressibility, respiratory phasicity and response to augmentation. Saphenofemoral Junction: No evidence of thrombus. Normal compressibility and flow on color Doppler imaging. Profunda Femoral Vein: No evidence of thrombus. Normal compressibility and flow on color Doppler imaging. Femoral Vein: No evidence of thrombus. Normal compressibility, respiratory phasicity and response to augmentation. Popliteal Vein: No evidence of thrombus. Normal compressibility, respiratory phasicity and response to augmentation. Calf Veins: No evidence of thrombus. Normal compressibility and flow on color Doppler imaging. Superficial Great Saphenous Vein: No evidence of thrombus. Normal compressibility. Venous Reflux:  None. Other Findings:  None. IMPRESSION: No evidence of DVT within either lower extremity. Electronically Signed   By: Simonne Come M.D.   On: 04/05/2020 07:34   DG Chest Portable 1 View  Result Date: 04/11/2020 CLINICAL DATA:  Dyspnea  EXAM: PORTABLE CHEST 1 VIEW COMPARISON:  None. FINDINGS: The lungs are well expanded and are symmetric. Minimal patchy pulmonary infiltrate may be present at the left lung base and within the right mid lung zone, possibly infectious or inflammatory in the acute setting. There is no pneumothorax or pleural effusion. Cardiac size within normal limits. No acute bone abnormality. IMPRESSION: Minimal patchy pulmonary infiltrate may be present at the left lung base  and within the right mid lung zone, possibly infectious or inflammatory in the acute setting. Electronically Signed   By: Helyn Numbers MD   On: 04/22/2020 19:39    Consults: Treatment Team:  Vida Rigger, MD   Subjective:    Overnight Issues:   Objective:  Vital signs for last 24 hours: Temp:  [96.4 F (35.8 C)-98 F (36.7 C)] 97.4 F (36.3 C) (08/30 0800) Pulse Rate:  [46-140] 68 (08/30 1100) Resp:  [14-24] 23 (08/30 1100) BP: (91-125)/(59-86) 107/76 (08/30 1100) SpO2:  [78 %-96 %] 94 % (08/30 1100) FiO2 (%):  [100 %] 100 % (08/30 0845)  Hemodynamic parameters for last 24 hours:    Intake/Output from previous day: 08/29 0701 - 08/30 0700 In: 720 [P.O.:720] Out: 2275 [Urine:2275]  Intake/Output this shift: No intake/output data recorded.  Vent settings for last 24 hours: FiO2 (%):  [100 %] 100 %  Physical Exam:  Physical examination is limited due to need for PPE/CAPR GENERAL: Awake, alert, no conversational dyspnea. HEAD: Normocephalic, atraumatic.  EYES: Pupils equal, round, reactive to light.  No scleral icterus.  MOUTH: Oral mucosa moist, no thrush. NECK: Supple. No thyromegaly. Trachea midline. No JVD.  No adenopathy. PULMONARY: Good air entry bilaterally.  No adventitious sounds. CARDIOVASCULAR: Monitor shows atrial fibrillation in good control ABDOMEN: Benign. MUSCULOSKELETAL: No joint deformity, no clubbing, no edema.  NEUROLOGIC: No focal deficits, speech is fluent. SKIN: Intact,warm,dry.  No rashes. PSYCH: Mood and behavior appropriate.  Assessment/Plan:   Acute respiratory failure with hypoxia due to COVID-19 COVID19 pneumonia -Remdesevir antiviral - pharmacy protocol 5 d -Baricitinib as Actemra on shortage -vitamin C -zinc -Solu-Medrol 40 mg every 12 -Diuresis -as needed and tolerated -Self proning as tolerated -Patient using IS and Acapella device for bronchopulmonary hygiene  -d/c hepatotoxic medications while on  remdesevir -supportive care with SDU telemetry monitoring -PT/OT when possible -procalcitonin, CRP and ferritin trending  Atrial fibrillation  -on eliquis and cardizem -rate controlled   ID  -Currently no evidence of bacterial infection -follow up cultures -Monitor WBC, trend procalcitonin  GI/Nutrition Good p.o. intake  ENDO -ICU hypoglycemic\Hyperglycemia protocol -check FSBS per protocol   ELECTROLYTES -follow labs as needed -replace as needed -pharmacy consultation    LOS: 8 days   Additional comments: Discussed during multidisciplinary rounds.  Care coordination with bedside nurse was done.  Discussed with hospitalist.  Critical Care Total Time*: Level 3 follow-up.  Gailen Shelter, MD [facility] 04/09/2020  *Care during the described time interval was provided by me and/or other providers on the critical care team.  I have reviewed this patient's available data, including medical history, events of note, physical examination and test results as part of my evaluation.  **This note was dictated using voice recognition software/Dragon.  Despite best efforts to proofread, errors can occur which can change the meaning.  Any change was purely unintentional.

## 2020-04-10 ENCOUNTER — Inpatient Hospital Stay: Payer: Medicare Other

## 2020-04-10 LAB — CBC WITH DIFFERENTIAL/PLATELET
Abs Immature Granulocytes: 0.14 10*3/uL — ABNORMAL HIGH (ref 0.00–0.07)
Basophils Absolute: 0 10*3/uL (ref 0.0–0.1)
Basophils Relative: 0 %
Eosinophils Absolute: 0 10*3/uL (ref 0.0–0.5)
Eosinophils Relative: 0 %
HCT: 35.7 % — ABNORMAL LOW (ref 39.0–52.0)
Hemoglobin: 12.3 g/dL — ABNORMAL LOW (ref 13.0–17.0)
Immature Granulocytes: 1 %
Lymphocytes Relative: 3 %
Lymphs Abs: 0.4 10*3/uL — ABNORMAL LOW (ref 0.7–4.0)
MCH: 30.7 pg (ref 26.0–34.0)
MCHC: 34.5 g/dL (ref 30.0–36.0)
MCV: 89 fL (ref 80.0–100.0)
Monocytes Absolute: 0.5 10*3/uL (ref 0.1–1.0)
Monocytes Relative: 3 %
Neutro Abs: 14.2 10*3/uL — ABNORMAL HIGH (ref 1.7–7.7)
Neutrophils Relative %: 93 %
Platelets: 208 10*3/uL (ref 150–400)
RBC: 4.01 MIL/uL — ABNORMAL LOW (ref 4.22–5.81)
RDW: 13.8 % (ref 11.5–15.5)
WBC: 15.3 10*3/uL — ABNORMAL HIGH (ref 4.0–10.5)
nRBC: 0 % (ref 0.0–0.2)

## 2020-04-10 LAB — COMPREHENSIVE METABOLIC PANEL
ALT: 44 U/L (ref 0–44)
AST: 27 U/L (ref 15–41)
Albumin: 2.1 g/dL — ABNORMAL LOW (ref 3.5–5.0)
Alkaline Phosphatase: 50 U/L (ref 38–126)
Anion gap: 7 (ref 5–15)
BUN: 36 mg/dL — ABNORMAL HIGH (ref 8–23)
CO2: 31 mmol/L (ref 22–32)
Calcium: 7.9 mg/dL — ABNORMAL LOW (ref 8.9–10.3)
Chloride: 96 mmol/L — ABNORMAL LOW (ref 98–111)
Creatinine, Ser: 0.82 mg/dL (ref 0.61–1.24)
GFR calc Af Amer: >60 mL/min (ref >60)
GFR calc non Af Amer: >60 mL/min (ref >60)
Glucose, Bld: 151 mg/dL — ABNORMAL HIGH (ref 70–99)
Potassium: 4.9 mmol/L (ref 3.5–5.1)
Sodium: 134 mmol/L — ABNORMAL LOW (ref 135–145)
Total Bilirubin: 1.2 mg/dL (ref 0.3–1.2)
Total Protein: 5.2 g/dL — ABNORMAL LOW (ref 6.5–8.1)

## 2020-04-10 LAB — GLUCOSE, CAPILLARY
Glucose-Capillary: 212 mg/dL — ABNORMAL HIGH (ref 70–99)
Glucose-Capillary: 141 mg/dL — ABNORMAL HIGH (ref 70–99)
Glucose-Capillary: 164 mg/dL — ABNORMAL HIGH (ref 70–99)
Glucose-Capillary: 225 mg/dL — ABNORMAL HIGH (ref 70–99)
Glucose-Capillary: 206 mg/dL — ABNORMAL HIGH (ref 70–99)

## 2020-04-10 LAB — FIBRIN DERIVATIVES D-DIMER (ARMC ONLY): Fibrin derivatives D-dimer (ARMC): >7500 ng/mL (FEU) — ABNORMAL HIGH (ref 0.00–499.00)

## 2020-04-10 LAB — FERRITIN: Ferritin: 1259 ng/mL — ABNORMAL HIGH (ref 24–336)

## 2020-04-10 LAB — C-REACTIVE PROTEIN: CRP: 0.6 mg/dL (ref <1.0)

## 2020-04-10 MED ORDER — DEXTROSE 5 % IV SOLN
15.0000 mg/kg/h | INTRAVENOUS | Status: DC
  Administered 2020-04-10 – 2020-04-15 (×8): 15 mg/kg/h via INTRAVENOUS
  Filled 2020-04-10 (×9): qty 120

## 2020-04-10 NOTE — Progress Notes (Signed)
Nutrition Follow-up  DOCUMENTATION CODES:   Not applicable  INTERVENTION:  Continue Ensure Enlive po TID, each supplement provides 350 kcal and 20 grams of protein  Continue Magic cup BID with meals, each supplement provides 290 kcal and 9 grams of protein  Recommend obtaining new weight to assess trends  NUTRITION DIAGNOSIS:   Increased nutrient needs related to catabolic illness (COVID-19) as evidenced by estimated needs. -ongoing  GOAL:   Patient will meet greater than or equal to 90% of their needs -progressing  MONITOR:   PO intake, Supplement acceptance, Labs, Weight trends, I & O's  REASON FOR ASSESSMENT:   Malnutrition Screening Tool    ASSESSMENT:  RD working remotely.  68 year old male with PMHx of A-fib admitted with COVID-19 PNA.  Patient remains on 50 L HHF, noted a little fatigued, endorses some shortness of breath, but appears well clinically. RD attempted to reach pt this afternoon via phone, however he did not answer. He is eating well, 100% of the last 3 documented intakes and drinking Ensure supplement TID per flowsheets.   No new weights this admission, recommend obtaining current weight to assess trends.   Medications reviewed and include: Vit C, D3, SSI, Methylprednisolone, Zinc sulfate Acetadote 24,000 mg in D5 Labs: CBGs 164,141,212,188,262,168,241 x 24 hrs, Na 134 (L), BUN 36 (H), WBC 15.3 (H), Hgb 12.3 (L), HCT 35.7 (L)  Diet Order:   Diet Order            Diet regular Room service appropriate? Yes; Fluid consistency: Thin  Diet effective now                 EDUCATION NEEDS:   No education needs have been identified at this time  Skin:  Skin Assessment: Reviewed RN Assessment  Last BM:  03/14/2020 per chart  Height:   Ht Readings from Last 1 Encounters:  04/02/20 6' (1.829 m)    Weight:   Wt Readings from Last 1 Encounters:  04/02/20 84.7 kg    BMI:  Body mass index is 25.33 kg/m.  Estimated Nutritional Needs:    Kcal:  2200-2400  Protein:  115-125 grams  Fluid:  >/= 2.2 L/day   Lars Masson, RD, LDN Clinical Nutrition After Hours/Weekend Pager # in Amion

## 2020-04-10 NOTE — Progress Notes (Signed)
Follow up - Critical Care Medicine Note  Patient Details:    Johnny Shepard is an 69 y.o. male withPMH atrial fibrillation presented with shortness of breath, recent outpatient diagnosis with Covid. Admitted for acute hypoxic respiratory failure secondary to Covid multifocal pneumonia, admitted to stepdown, PCCM consulting.  Lines, Airways, Drains:    Anti-infectives:  Anti-infectives (From admission, onward)   Start     Dose/Rate Route Frequency Ordered Stop   04/08/20 0930  ivermectin (STROMECTOL) tablet 12,000 mcg        150 mcg/kg  84.7 kg Oral  Once 04/08/20 0844 04/08/20 1246   04/02/20 1000  remdesivir 100 mg in sodium chloride 0.9 % 100 mL IVPB       "Followed by" Linked Group Details   100 mg 200 mL/hr over 30 Minutes Intravenous Daily 04-21-20 2025 04/05/20 1947   04/21/20 2200  remdesivir 200 mg in sodium chloride 0.9% 250 mL IVPB       "Followed by" Linked Group Details   200 mg 580 mL/hr over 30 Minutes Intravenous Once Apr 21, 2020 2025 04/02/20 0015    Medications reviewed with Pharm.D.   Microbiology: Results for orders placed or performed during the hospital encounter of 2020-04-21  SARS Coronavirus 2 by RT PCR (hospital order, performed in [facility] hospital lab) Nasopharyngeal Nasopharyngeal Swab     Status: Abnormal   Collection Time: 2020/04/21  7:53 PM   Specimen: Nasopharyngeal Swab  Result Value Ref Range Status   SARS Coronavirus 2 POSITIVE (A) NEGATIVE Final    Comment: RESULT CALLED TO, READ BACK BY AND VERIFIED WITH: LEA FERGUISON 04-21-20 AT 2115 BY AR (NOTE) SARS-CoV-2 target nucleic acids are DETECTED  SARS-CoV-2 RNA is generally detectable in upper respiratory specimens  during the acute phase of infection.  Positive results are indicative  of the presence of the identified virus, but do not rule out bacterial infection or co-infection with other pathogens not detected by the test.  Clinical correlation with patient history and  other diagnostic  information is necessary to determine patient infection status.  The expected result is negative.  Fact Sheet for Patients:   BoilerBrush.com.cy   Fact Sheet for Healthcare Providers:   https://pope.com/    This test is not yet approved or cleared by the [place] FDA and  has been authorized for detection and/or diagnosis of SARS-CoV-2 by FDA under an Emergency Use Authorization (EUA).  This EUA will remain in effect (meaning this t est can be used) for the duration of  the COVID-19 declaration under Section 564(b)(1) of the Act, 21 U.S.C. section 360-bbb-3(b)(1), unless the authorization is terminated or revoked sooner.  Performed at [facility], [address] [place], [place] [address]   Culture, blood (Routine X 2) w Reflex to ID Panel     Status: None   Collection Time: 04/21/2020  9:20 PM   Specimen: BLOOD  Result Value Ref Range Status   Specimen Description BLOOD BLOOD RIGHT FOREARM  Final   Special Requests   Final    BOTTLES DRAWN AEROBIC AND ANAEROBIC Blood Culture adequate volume   Culture   Final    NO GROWTH 5 DAYS Performed at [facility], [address], [place], [place] [address]    Report Status 04/06/2020 FINAL  Final  Culture, blood (Routine X 2) w Reflex to ID Panel     Status: None   Collection Time: 2020-04-21  9:31 PM   Specimen: BLOOD  Result Value Ref Range Status  Specimen Description BLOOD RIGHT ANTECUBITAL  Final   Special Requests   Final    BOTTLES DRAWN AEROBIC AND ANAEROBIC Blood Culture results may not be optimal due to an excessive volume of blood received in culture bottles   Culture   Final    NO GROWTH 5 DAYS Performed at [facility], [address] [place], [place] [address]    Report Status 04/06/2020 FINAL  Final  MRSA PCR Screening     Status: None   Collection Time: 04/02/20 10:00 AM   Specimen: Nasal Mucosa; Nasopharyngeal  Result Value Ref  Range Status   MRSA by PCR NEGATIVE NEGATIVE Final    Comment:        The GeneXpert MRSA Assay (FDA approved for NASAL specimens only), is one component of a comprehensive MRSA colonization surveillance program. It is not intended to diagnose MRSA infection nor to guide or monitor treatment for MRSA infections. Performed at [facility], [address], [place], [place] [address]    Laboratory data reviewed.   Best Practice/Protocols:  VTE Prophylaxis: Direct Thrombin Inhibitor GI Prophylaxis: Antihistamine   Events: 04/07/20- no overnight events patient verbalizes in full sentences, relates clinical improvement. 04/08/20- patient in good spirits , he is still on elevated settings with HFNC.Encourage use of IS. 8/30- Requiring HFNC + NRB,no conversational dyspnea. 8/31-pneumomediastinum noted on chest x-ray, acetylcysteine infusion 5-day protocol started.  Studies: US Venous Img Lower Bilateral (DVT)  Result Date: 04/05/2020 CLINICAL DATA:  Bilateral lower extremity pain and edema. Elevated D-dimer. Patient is currently on anticoagulation. Evaluate for DVT. EXAM: BILATERAL LOWER EXTREMITY VENOUS DOPPLER ULTRASOUND TECHNIQUE: Gray-scale sonography with graded compression, as well as color Doppler and duplex ultrasound were performed to evaluate the lower extremity deep venous systems from the level of the common femoral vein and including the common femoral, femoral, profunda femoral, popliteal and calf veins including the posterior tibial, peroneal and gastrocnemius veins when visible. The superficial great saphenous vein was also interrogated. Spectral Doppler was utilized to evaluate flow at rest and with distal augmentation maneuvers in the common femoral, femoral and popliteal veins. COMPARISON:  None. FINDINGS: RIGHT LOWER EXTREMITY Common Femoral Vein: No evidence of thrombus. Normal compressibility, respiratory phasicity and response to augmentation. Saphenofemoral  Junction: No evidence of thrombus. Normal compressibility and flow on color Doppler imaging. Profunda Femoral Vein: No evidence of thrombus. Normal compressibility and flow on color Doppler imaging. Femoral Vein: No evidence of thrombus. Normal compressibility, respiratory phasicity and response to augmentation. Popliteal Vein: No evidence of thrombus. Normal compressibility, respiratory phasicity and response to augmentation. Calf Veins: No evidence of thrombus. Normal compressibility and flow on color Doppler imaging. Superficial Great Saphenous Vein: No evidence of thrombus. Normal compressibility. Venous Reflux:  None. Other Findings:  None. LEFT LOWER EXTREMITY Common Femoral Vein: No evidence of thrombus. Normal compressibility, respiratory phasicity and response to augmentation. Saphenofemoral Junction: No evidence of thrombus. Normal compressibility and flow on color Doppler imaging. Profunda Femoral Vein: No evidence of thrombus. Normal compressibility and flow on color Doppler imaging. Femoral Vein: No evidence of thrombus. Normal compressibility, respiratory phasicity and response to augmentation. Popliteal Vein: No evidence of thrombus. Normal compressibility, respiratory phasicity and response to augmentation. Calf Veins: No evidence of thrombus. Normal compressibility and flow on color Doppler imaging. Superficial Great Saphenous Vein: No evidence of thrombus. Normal compressibility. Venous Reflux:  None. Other Findings:  None. IMPRESSION: No evidence of DVT within either lower extremity. Electronically Signed   By: Holland Commons.D.  On: 04/05/2020 07:34   DG Chest Port 1 View  Result Date: 04/10/2020 CLINICAL DATA:  Shortness of breath. EXAM: PORTABLE CHEST 1 VIEW COMPARISON:  April 01, 2020. FINDINGS: Stable cardiomediastinal silhouette. No pneumothorax or pleural effusion is noted. Stable bibasilar lung opacities are noted concerning for pneumonia or possibly infiltrates. However, there may be  pneumomediastinum present at this time. CT scan is recommended for further evaluation. Bony thorax is unremarkable. IMPRESSION: Stable bibasilar lung opacities are noted concerning for pneumonia or possibly infiltrates. However, there may be pneumomediastinum present at this time. CT scan of the chest is recommended for further evaluation. These results will be called to the ordering clinician or representative by the Radiologist Assistant, and communication documented in the PACS or zVision Dashboard. Electronically Signed   By: Lupita Raider M.D.   On: 04/10/2020 08:29   DG Chest Portable 1 View  Result Date: 03/13/2020 CLINICAL DATA:  Dyspnea EXAM: PORTABLE CHEST 1 VIEW COMPARISON:  None. FINDINGS: The lungs are well expanded and are symmetric. Minimal patchy pulmonary infiltrate may be present at the left lung base and within the right mid lung zone, possibly infectious or inflammatory in the acute setting. There is no pneumothorax or pleural effusion. Cardiac size within normal limits. No acute bone abnormality. IMPRESSION: Minimal patchy pulmonary infiltrate may be present at the left lung base and within the right mid lung zone, possibly infectious or inflammatory in the acute setting. Electronically Signed   By: Helyn Numbers MD   On: 04/07/2020 19:39    Consults: Treatment Team:  Vida Rigger, MD Pccm, [facility], MD   Subjective:    Overnight Issues: Remains with high FiO2 requirement.  No conversational dyspnea.  In good spirits.  Doing self proning as tolerated.  Using incentive spirometer and Acapella.  Small pneumomediastinum noted on chest x-ray, he has no subcu emphysema.  Objective:  Vital signs for last 24 hours: Temp:  [97 F (36.1 C)-98.5 F (36.9 C)] 97.1 F (36.2 C) (08/31 1600) Pulse Rate:  [57-96] 64 (08/31 1700) Resp:  [15-20] 20 (08/31 1700) BP: (87-117)/(62-84) 101/66 (08/31 1700) SpO2:  [86 %-99 %] 97 % (08/31 1700) FiO2 (%):  [100 %] 100 % (08/31  2054)  Hemodynamic parameters for last 24 hours:    Intake/Output from previous day: 08/30 0701 - 08/31 0700 In: -  Out: 2250 [Urine:2250]  Intake/Output this shift: No intake/output data recorded.  Vent settings for last 24 hours: FiO2 (%):  [100 %] 100 %  Physical Exam:  Physical examination is limited due to need for PPE/CAPR GENERAL: Awake, alert, no conversational dyspnea.  On high flow nasal cannula, intermittent use of nonrebreather as well. HEAD: Normocephalic, atraumatic.  EYES: Pupils equal, round, reactive to light.  No scleral icterus.  MOUTH: Oral mucosa moist, no thrush. NECK: Supple.  No crepitus. Trachea midline. No JVD.  No adenopathy. PULMONARY: Good air entry bilaterally.  Coarse. Examination is limited due to need for PPE/CAPR. CARDIOVASCULAR: Monitor shows atrial fibrillation in good control. ABDOMEN: Benign. MUSCULOSKELETAL: No joint deformity, no clubbing, no edema.  NEUROLOGIC: No focal deficits, speech is fluent. SKIN: Intact,warm,dry.  No rashes. PSYCH: Mood and behavior appropriate.  Assessment/Plan:  Acute respiratory failure with hypoxia due to COVID-19 COVID19 pneumonia -Remdesevir antiviral - pharmacy protocol 5 d -Baricitinib as Actemra on shortage -vitamin C -zinc -Solu-Medrol 40 mg every 12 -Diuresis -as needed and tolerated -Self proning as tolerated -Patient using IS and Acapella device for bronchopulmonary hygiene  -d/c hepatotoxic medications while  on remdesevir -supportive care with SDU telemetry monitoring -PT/OT when possible -procalcitonin, CRP and ferritin trending  Pneumomediastinum Has been observed with delta variant of COVID-19 Nothing to do for now Follow chest x-ray as needed  Atrial fibrillation  -on eliquis and cardizem -rate controlled   ID  -Currently no evidence of bacterial infection -follow up cultures -Monitor WBC, trend procalcitonin  GI/Nutrition Good p.o. intake  ENDO -ICU  hypoglycemic\Hyperglycemia protocol -check FSBS per protocol   ELECTROLYTES -follow labs as needed -replace as needed -pharmacy consultation    LOS: 9 days   Additional comments:Discussed during multidisciplinary rounds.  Care coordination with bedside nurse was done.  Discussed with hospitalist.   Critical Care Total Time*: Level 3 follow-up    C. Danice GoltzLaura Aiesha Leland, MD [place] [facility] 04/10/2020  *Care during the described time interval was provided by me and/or other providers on the critical care team.  I have reviewed this patient's available data, including medical history, events of note, physical examination and test results as part of my evaluation.  **This note was dictated using voice recognition software/Dragon.  Despite best efforts to proofread, errors can occur which can change the meaning.  Any change was purely unintentional.

## 2020-04-10 NOTE — Progress Notes (Signed)
PROGRESS NOTE    Johnny Shepard  JTT:[id number] DOB: [birth year] DOA: 04/07/2020 PCP: Patient, No Pcp Per   Brief Narrative:  69 year old man PMH atrial fibrillation presented with shortness of breath, recent outpatient diagnosis with Covid.  Admitted for acute hypoxic respiratory failure secondary to Covid multifocal pneumonia.  8/25: Patient seen and examined.  Lying prone in bed.  Still on 50 L high flow in addition nonrebreather.  Saturations in the 90s on above oxygen.  Mentating clearly.  Normal work of breathing.  8/26: Patient seen and examined.  Clinically appears better today.  Remains on 50 L high flow nasal cannula and nonrebreather.  Saturations mid 90s.  Mentating clearly.  Normal work of breathing.  8/27: Patient seen and examined.  Continues to subjectively improve on my evaluation.  Sitting up in bed.  Eating.  Speaking in complete sentences.  Remains on 50 L high flow nasal cannula in addition to nonrebreather.  Saturations been 90.  Mentating very clearly.  Normal work of breathing.  8/28: Patient seen and examined.  Sitting up in bed.  Mentating clearly.  Normal work of breathing.  Was able to remove the nonrebreather without any negative effects on his saturations.  Now just on 50 L heated high flow nasal cannula.  8/29: Patient seen and examined. Sitting up in chair. Feels well overall. Mentating clearly. Normal work of breathing. Remains on 50 L heated high flow nasal cannula. Was able to make it through the night without a nonrebreather.  8/30: Patient seen and examined.  Sitting up in chair.  Clinically appears well.  Little fatigued.  Remains dependent on 50 L heated high flow nasal cannula.    8/31: Patient seen and examined.  Remains on 50 L heated high flow nasal cannula.  Clinically appears well.  Getting a little fatigued but still mentating clearly.   Assessment & Plan:   Principal Problem:   Acute hypoxemic respiratory failure due to COVID-19 [facility]) Active  Problems:   Pneumonia due to COVID-19 virus   Unspecified atrial fibrillation (HCC)   Thrombocytopenia (HCC)   Elevated transaminase level  Acute hypoxemic respiratory failure due to COVID-19 [facility]) --appears about the same today, remains on HHF 50L, NRB w/ SpO2 in high 80s --8/23 CXR Minimal patchy pulmonary infiltrate may be present at the left lung base and within the right mid lung zone --oxygen HHF 50L --inflammatory markers  Ferritin 1605 > 1551 > 1558>1456>1023  CRP 5.9 > 4.8 > 2.5 >1.1>0.7  Fibrin derivatives 935 > 819 > 1437 >2608>7500  CTA could not be performed.  Lower extremity Doppler negative for PE  Plan:  Remdesivir 8/22 > 04/05/20  Steroids 8/22 >   Baricitinib 8/23 >  Prone  Mobility as tolerated Patient is 10L net negative.  Hold off on diuretics for now.  Reevaluate daily for diuretic need  Unspecified atrial fibrillation (HCC) S/p Watchman procedure at [facility] --stable; continue apixaban, diltiazem  Pneumonia due to COVID-19 virus --plan as above  Thrombocytopenia (HCC) --secondary to acute illness, resolved.  Elevated transaminase level --AST slightly decreased. Will monitor w/ daily CMP   DVT prophylaxis: Eliquis Code Status: Full Family Communication: Wife via phone [phone] on 04/10/2020 Disposition Plan:Status is: Inpatient  Remains inpatient appropriate because:Inpatient level of care appropriate due to severity of illness   Dispo: The patient is from: Home              Anticipated d/c is to: Home  Anticipated d/c date is: > 3 days              Patient currently is not medically stable to d/c.   Patient remains critically ill.  Severe acute hypoxic respiratory failure secondary to COVID-19 pneumonia.  Clinically feels well.  Motivated to improve..  Adherent to proning and incentive spirometry.  Prognosis guarded   Consultants:   None  Procedures:   None  Antimicrobials:    Remdesivir   Subjective: Patient seen and examined.  Endorses some shortness of breath.  Speaking complete sentences.  Remains on 50 L heated high flow  Objective: Vitals:   04/10/20 0900 04/10/20 1000 04/10/20 1100 04/10/20 1200  BP: 111/84 113/82 94/72 93/77   Pulse: 75 80 83 96  Resp: 19 19 19 20   Temp:      TempSrc:      SpO2: 95% 92% 93% (!) 86%  Weight:      Height:        Intake/Output Summary (Last 24 hours) at 04/10/2020 1424 Last data filed at 04/10/2020 1141 Gross per 24 hour  Intake 20 ml  Output 3450 ml  Net -3430 ml   Filed Weights   03/19/2020 1852 04/02/20 0915  Weight: 79 kg 84.7 kg    Examination:  General: Appears well.  Mildly fatigued HEENT: Normocephalic, atraumatic Neck, supple, trachea midline, no tenderness Heart: Regular rate and rhythm, S1/S2 normal, no murmurs Lungs: Air entry equal bilaterally.  No work of breathing.  50 L heated high flow.  Bibasilar crackles Abdomen: Soft, nontender, nondistended, positive bowel sounds Extremities: Normal, atraumatic, no clubbing or cyanosis, normal muscle tone Skin: No rashes or lesions, normal color Neurologic: Cranial nerves grossly intact, sensation intact, alert and oriented x3 Psychiatric: Normal affect        Data Reviewed: I have personally reviewed following labs and imaging studies  CBC: Recent Labs  Lab 04/06/20 0439 04/07/20 0830 04/08/20 0517 04/09/20 0409 04/10/20 0523  WBC 14.4* 17.4* 12.5* 13.3* 15.3*  NEUTROABS 13.1* 16.4* 11.7* 12.5* 14.2*  HGB 13.4 13.8 12.8* 12.5* 12.3*  HCT 39.4 40.1 35.8* 36.5* 35.7*  MCV 88.9 86.4 85.9 87.7 89.0  PLT 272 271 251 233 208   Basic Metabolic Panel: Recent Labs  Lab 04/04/20 0338 04/04/20 0338 04/05/20 0408 04/06/20 0439 04/07/20 0830 04/08/20 0517 04/09/20 0409 04/10/20 0523  NA 133*   < > 137  --  135 134* 135 134*  K 4.2   < > 4.1  --  4.4 3.9 5.0 4.9  CL 100   < > 101  --  92* 94* 96* 96*  CO2 24   < > 26  --  32 34*  31 31  GLUCOSE 146*   < > 170*  --  240* 183* 155* 151*  BUN 27*   < > 27*  --  37* 33* 32* 36*  CREATININE 0.71   < > 0.68  --  0.81 0.67 0.75 0.82  CALCIUM 8.0*   < > 7.9*  --  8.1* 7.9* 8.1* 7.9*  MG 2.3  --  2.2 2.4  --   --   --   --    < > = values in this interval not displayed.   GFR: Estimated Creatinine Clearance: 93.3 mL/min (by C-G formula based on SCr of 0.82 mg/dL). Liver Function Tests: Recent Labs  Lab 04/05/20 0408 04/07/20 0830 04/08/20 0517 04/09/20 0409 04/10/20 0523  AST 50* 33 24 30 27   ALT 58* 48* 44  44 44  ALKPHOS 45 49 46 52 50  BILITOT 1.0 1.0 1.2 1.1 1.2  PROT 5.7* 5.9* 5.3* 5.3* 5.2*  ALBUMIN 2.4* 2.4* 2.1* 2.1* 2.1*   No results for input(s): LIPASE, AMYLASE in the last 168 hours. No results for input(s): AMMONIA in the last 168 hours. Coagulation Profile: No results for input(s): INR, PROTIME in the last 168 hours. Cardiac Enzymes: No results for input(s): CKTOTAL, CKMB, CKMBINDEX, TROPONINI in the last 168 hours. BNP (last 3 results) No results for input(s): PROBNP in the last 8760 hours. HbA1C: No results for input(s): HGBA1C in the last 72 hours. CBG: Recent Labs  Lab 04/09/20 2020 04/09/20 2333 04/10/20 0351 04/10/20 0748 04/10/20 1138  GLUCAP 262* 188* 212* 141* 164*   Lipid Profile: No results for input(s): CHOL, HDL, LDLCALC, TRIG, CHOLHDL, LDLDIRECT in the last 72 hours. Thyroid Function Tests: No results for input(s): TSH, T4TOTAL, FREET4, T3FREE, THYROIDAB in the last 72 hours. Anemia Panel: Recent Labs    04/10/20 0523  FERRITIN 1,259*   Sepsis Labs: No results for input(s): PROCALCITON, LATICACIDVEN in the last 168 hours.  Recent Results (from the past 240 hour(s))  SARS Coronavirus 2 by RT PCR (hospital order, [facility]) Nasopharyngeal Nasopharyngeal Swab     Status: Abnormal   Collection Time: 03/28/2020  7:53 PM   Specimen: Nasopharyngeal Swab  Result Value Ref Range Status   SARS  Coronavirus 2 POSITIVE (A) NEGATIVE Final    Comment: RESULT CALLED TO, READ BACK BY AND VERIFIED WITH: LEA FERGUISON 03/29/2020 AT 2115 BY AR (NOTE) SARS-CoV-2 target nucleic acids are DETECTED  SARS-CoV-2 RNA is generally detectable in upper respiratory specimens  during the acute phase of infection.  Positive results are indicative  of the presence of the identified virus, but do not rule out bacterial infection or co-infection with other pathogens not detected by the test.  Clinical correlation with patient history and  other diagnostic information is necessary to determine patient infection status.  The expected result is negative.  Fact Sheet for Patients:   BoilerBrush.com.cyhttps://www.fda.gov/media/136312/download   Fact Sheet for Healthcare Providers:   https://pope.com/https://www.fda.gov/media/136313/download    This test is not yet approved or cleared by the [place] FDA and  has been authorized for detection and/or diagnosis of SARS-CoV-2 by FDA under an Emergency Use Authorization (EUA).  This EUA will remain in effect (meaning this t est can be used) for the duration of  the COVID-19 declaration under Section 564(b)(1) of the Act, 21 U.S.C. section 360-bbb-3(b)(1), unless the authorization is terminated or revoked sooner.  Performed at [facility], [address] [place], [place] [address]   Culture, blood (Routine X 2) w Reflex to ID Panel     Status: None   Collection Time: 03/18/2020  9:20 PM   Specimen: BLOOD  Result Value Ref Range Status   Specimen Description BLOOD BLOOD RIGHT FOREARM  Final   Special Requests   Final    BOTTLES DRAWN AEROBIC AND ANAEROBIC Blood Culture adequate volume   Culture   Final    NO GROWTH 5 DAYS Performed at [facility], [address] [place], [place] [address]    Report Status 04/06/2020 FINAL  Final  Culture, blood (Routine X 2) w Reflex to ID Panel     Status: None   Collection Time: 04/08/2020  9:31 PM   Specimen: BLOOD  Result  Value Ref Range Status   Specimen Description BLOOD RIGHT ANTECUBITAL  Final  Special Requests   Final    BOTTLES DRAWN AEROBIC AND ANAEROBIC Blood Culture results may not be optimal due to an excessive volume of blood received in culture bottles   Culture   Final    NO GROWTH 5 DAYS Performed at [facility], [address] [place], [place] [address]    Report Status 04/06/2020 FINAL  Final  MRSA PCR Screening     Status: None   Collection Time: 04/02/20 10:00 AM   Specimen: Nasal Mucosa; Nasopharyngeal  Result Value Ref Range Status   MRSA by PCR NEGATIVE NEGATIVE Final    Comment:        The GeneXpert MRSA Assay (FDA approved for NASAL specimens only), is one component of a comprehensive MRSA colonization surveillance program. It is not intended to diagnose MRSA infection nor to guide or monitor treatment for MRSA infections. Performed at [facility], [address], [place], [place] [address]          Radiology Studies: [facility] Chest [place] 1 View  Result Date: 04/10/2020 CLINICAL DATA:  Shortness of breath. EXAM: PORTABLE CHEST 1 VIEW COMPARISON:  04/22/2020. FINDINGS: Stable cardiomediastinal silhouette. No pneumothorax or pleural effusion is noted. Stable bibasilar lung opacities are noted concerning for pneumonia or possibly infiltrates. However, there may be pneumomediastinum present at this time. CT scan is recommended for further evaluation. Bony thorax is unremarkable. IMPRESSION: Stable bibasilar lung opacities are noted concerning for pneumonia or possibly infiltrates. However, there may be pneumomediastinum present at this time. CT scan of the chest is recommended for further evaluation. These results will be called to the ordering clinician or representative by the Radiologist Assistant, and communication documented in the PACS or zVision Dashboard. Electronically Signed   By: Lupita Raider M.D.   On: 04/10/2020 08:29        Scheduled  Meds: . apixaban  5 mg Oral BID  . vitamin C  500 mg Oral Daily  . aspirin EC  325 mg Oral Daily  . baricitinib  4 mg Oral Daily  . chlorhexidine  15 mL Mouth Rinse BID  . Chlorhexidine Gluconate Cloth  6 each Topical Daily  . cholecalciferol  1,000 Units Oral Daily  . diltiazem  180 mg Oral Q12H  . famotidine  20 mg Oral BID  . feeding supplement (ENSURE ENLIVE)  237 mL Oral TID BM  . furosemide  40 mg Intravenous Daily  . guaiFENesin  600 mg Oral BID  . insulin aspart  0-5 Units Subcutaneous QHS  . insulin aspart  0-6 Units Subcutaneous TID WC  . mouth rinse  15 mL Mouth Rinse q12n4p  . methylPREDNISolone (SOLU-MEDROL) injection  40 mg Intravenous Q12H  . zinc sulfate  220 mg Oral Daily   Continuous Infusions:    LOS: 9 days    Time spent: 25 minutes    Tresa Moore, MD [facility] Pager [phone]  If 7PM-7AM, please contact night-coverage 04/10/2020, 2:24 PM

## 2020-04-11 DIAGNOSIS — I482 Chronic atrial fibrillation, unspecified: Secondary | ICD-10-CM

## 2020-04-11 DIAGNOSIS — J982 Interstitial emphysema: Secondary | ICD-10-CM

## 2020-04-11 LAB — GLUCOSE, CAPILLARY
Glucose-Capillary: 164 mg/dL — ABNORMAL HIGH (ref 70–99)
Glucose-Capillary: 208 mg/dL — ABNORMAL HIGH (ref 70–99)
Glucose-Capillary: 189 mg/dL — ABNORMAL HIGH (ref 70–99)
Glucose-Capillary: 178 mg/dL — ABNORMAL HIGH (ref 70–99)

## 2020-04-11 LAB — COMPREHENSIVE METABOLIC PANEL
ALT: 43 U/L (ref 0–44)
AST: 24 U/L (ref 15–41)
Albumin: 2.2 g/dL — ABNORMAL LOW (ref 3.5–5.0)
Alkaline Phosphatase: 55 U/L (ref 38–126)
Anion gap: 9 (ref 5–15)
BUN: 41 mg/dL — ABNORMAL HIGH (ref 8–23)
CO2: 30 mmol/L (ref 22–32)
Calcium: 8.4 mg/dL — ABNORMAL LOW (ref 8.9–10.3)
Chloride: 94 mmol/L — ABNORMAL LOW (ref 98–111)
Creatinine, Ser: 0.75 mg/dL (ref 0.61–1.24)
GFR calc Af Amer: >60 mL/min (ref >60)
GFR calc non Af Amer: >60 mL/min (ref >60)
Glucose, Bld: 205 mg/dL — ABNORMAL HIGH (ref 70–99)
Potassium: 4.7 mmol/L (ref 3.5–5.1)
Sodium: 133 mmol/L — ABNORMAL LOW (ref 135–145)
Total Bilirubin: 0.9 mg/dL (ref 0.3–1.2)
Total Protein: 5.5 g/dL — ABNORMAL LOW (ref 6.5–8.1)

## 2020-04-11 LAB — CBC WITH DIFFERENTIAL/PLATELET
Abs Immature Granulocytes: 0.18 10*3/uL — ABNORMAL HIGH (ref 0.00–0.07)
Basophils Absolute: 0 10*3/uL (ref 0.0–0.1)
Basophils Relative: 0 %
Eosinophils Absolute: 0 10*3/uL (ref 0.0–0.5)
Eosinophils Relative: 0 %
HCT: 36.9 % — ABNORMAL LOW (ref 39.0–52.0)
Hemoglobin: 13.3 g/dL (ref 13.0–17.0)
Immature Granulocytes: 1 %
Lymphocytes Relative: 3 %
Lymphs Abs: 0.5 10*3/uL — ABNORMAL LOW (ref 0.7–4.0)
MCH: 30.4 pg (ref 26.0–34.0)
MCHC: 36 g/dL (ref 30.0–36.0)
MCV: 84.4 fL (ref 80.0–100.0)
Monocytes Absolute: 0.7 10*3/uL (ref 0.1–1.0)
Monocytes Relative: 4 %
Neutro Abs: 16.5 10*3/uL — ABNORMAL HIGH (ref 1.7–7.7)
Neutrophils Relative %: 92 %
Platelets: 204 10*3/uL (ref 150–400)
RBC: 4.37 MIL/uL (ref 4.22–5.81)
RDW: 13.7 % (ref 11.5–15.5)
WBC: 17.8 10*3/uL — ABNORMAL HIGH (ref 4.0–10.5)
nRBC: 0 % (ref 0.0–0.2)

## 2020-04-11 LAB — FIBRIN DERIVATIVES D-DIMER (ARMC ONLY): Fibrin derivatives D-dimer (ARMC): >7500 ng/mL (FEU) — ABNORMAL HIGH (ref 0.00–499.00)

## 2020-04-11 LAB — MAGNESIUM: Magnesium: 2.5 mg/dL — ABNORMAL HIGH (ref 1.7–2.4)

## 2020-04-11 LAB — C-REACTIVE PROTEIN: CRP: 0.6 mg/dL (ref <1.0)

## 2020-04-11 LAB — FERRITIN: Ferritin: 1095 ng/mL — ABNORMAL HIGH (ref 24–336)

## 2020-04-11 LAB — PHOSPHORUS: Phosphorus: 3 mg/dL (ref 2.5–4.6)

## 2020-04-11 MED ORDER — NYSTATIN 100000 UNIT/ML MT SUSP
5.0000 mL | Freq: Four times a day (QID) | OROMUCOSAL | Status: DC
  Administered 2020-04-11 – 2020-04-15 (×16): 500000 [IU] via ORAL
  Filled 2020-04-11 (×22): qty 5

## 2020-04-11 NOTE — Progress Notes (Signed)
Patient ID: Johnny Shepard, male   DOB: 1951/07/15, 69 y.o.   MRN: [id number] [facility] PROGRESS NOTE  Johnny Shepard [id number]:[id number] DOB: Nov 06, 1950 DOA: 2020-04-20 PCP: Patient, No Pcp Per  HPI/Subjective: Admitted with shortness of breath and found to have COVID-19 pneumonia.  Patient feeling okay.  Has some cough.  Does not feel overly short of breath.  No pain.  No diarrhea.  Objective: Vitals:   04/11/20 1000 04/11/20 1200  BP: 125/87 122/84  Pulse: 93 (!) 129  Resp: 19 (!) 21  Temp:  (!) 97 F (36.1 C)  SpO2: 95% 91%    Intake/Output Summary (Last 24 hours) at 04/11/2020 1246 Last data filed at 04/11/2020 1200 Gross per 24 hour  Intake 868.5 ml  Output 870 ml  Net -1.5 ml   Filed Weights   2020/04/20 1852 04/02/20 0915  Weight: 79 kg 84.7 kg    ROS: Review of Systems  Respiratory: Positive for cough and shortness of breath.   Cardiovascular: Negative for chest pain.  Gastrointestinal: Negative for abdominal pain, nausea and vomiting.   Exam: Physical Exam HENT:     Nose: No mucosal edema.     Mouth/Throat:     Pharynx: No oropharyngeal exudate.     Comments: Thrush. Eyes:     General: Lids are normal.     Conjunctiva/sclera: Conjunctivae normal.     Pupils: Pupils are equal, round, and reactive to light.  Cardiovascular:     Rate and Rhythm: Tachycardia present. Rhythm irregularly irregular.     Heart sounds: Normal heart sounds, S1 normal and S2 normal.  Pulmonary:     Breath sounds: Examination of the right-middle field reveals decreased breath sounds. Examination of the left-middle field reveals decreased breath sounds. Examination of the right-lower field reveals decreased breath sounds and rhonchi. Examination of the left-lower field reveals decreased breath sounds and rhonchi. Decreased breath sounds and rhonchi present. No wheezing or rales.  Abdominal:     Palpations: Abdomen is soft.     Tenderness: There is no abdominal tenderness.   Musculoskeletal:     Right ankle: No swelling.     Left ankle: No swelling.  Skin:    General: Skin is warm.     Findings: No rash.  Neurological:     Mental Status: He is alert and oriented to person, place, and time.       Data Reviewed: Basic Metabolic Panel: Recent Labs  Lab 04/05/20 0408 04/05/20 0408 04/06/20 2703 04/07/20 0830 04/08/20 0517 04/09/20 0409 04/10/20 0523 04/11/20 0415  NA 137   < >  --  135 134* 135 134* 133*  K 4.1   < >  --  4.4 3.9 5.0 4.9 4.7  CL 101   < >  --  92* 94* 96* 96* 94*  CO2 26   < >  --  32 34* 31 31 30   GLUCOSE 170*   < >  --  240* 183* 155* 151* 205*  BUN 27*   < >  --  37* 33* 32* 36* 41*  CREATININE 0.68   < >  --  0.81 0.67 0.75 0.82 0.75  CALCIUM 7.9*   < >  --  8.1* 7.9* 8.1* 7.9* 8.4*  MG 2.2  --  2.4  --   --   --   --  2.5*  PHOS  --   --   --   --   --   --   --  3.0   < > = values in this interval not displayed.   Liver Function Tests: Recent Labs  Lab 04/07/20 0830 04/08/20 0517 04/09/20 0409 04/10/20 0523 04/11/20 0415  AST 33 24 30 27 24   ALT 48* 44 44 44 43  ALKPHOS 49 46 52 50 55  BILITOT 1.0 1.2 1.1 1.2 0.9  PROT 5.9* 5.3* 5.3* 5.2* 5.5*  ALBUMIN 2.4* 2.1* 2.1* 2.1* 2.2*   CBC: Recent Labs  Lab 04/07/20 0830 04/08/20 0517 04/09/20 0409 04/10/20 0523 04/11/20 0415  WBC 17.4* 12.5* 13.3* 15.3* 17.8*  NEUTROABS 16.4* 11.7* 12.5* 14.2* 16.5*  HGB 13.8 12.8* 12.5* 12.3* 13.3  HCT 40.1 35.8* 36.5* 35.7* 36.9*  MCV 86.4 85.9 87.7 89.0 84.4  PLT 271 251 233 208 204   BNP (last 3 results) Recent Labs    03/23/2020 2120  BNP 92.0     CBG: Recent Labs  Lab 04/10/20 1138 04/10/20 1558 04/10/20 2006 04/11/20 0716 04/11/20 1152  GLUCAP 164* 225* 206* 164* 208*    Recent Results (from the past 240 hour(s))  SARS Coronavirus 2 by RT PCR (hospital order, performed in [facility] hospital lab) Nasopharyngeal Nasopharyngeal Swab     Status: Abnormal   Collection Time: 03/25/2020  7:53 PM    Specimen: Nasopharyngeal Swab  Result Value Ref Range Status   SARS Coronavirus 2 POSITIVE (A) NEGATIVE Final    Comment: RESULT CALLED TO, READ BACK BY AND VERIFIED WITH: LEA FERGUISON 03/20/2020 AT 2115 BY AR (NOTE) SARS-CoV-2 target nucleic acids are DETECTED  SARS-CoV-2 RNA is generally detectable in upper respiratory specimens  during the acute phase of infection.  Positive results are indicative  of the presence of the identified virus, but do not rule out bacterial infection or co-infection with other pathogens not detected by the test.  Clinical correlation with patient history and  other diagnostic information is necessary to determine patient infection status.  The expected result is negative.  Fact Sheet for Patients:   2116   Fact Sheet for Healthcare Providers:   BoilerBrush.com.cy    This test is not yet approved or cleared by the https://pope.com/ FDA and  has been authorized for detection and/or diagnosis of SARS-CoV-2 by FDA under an Emergency Use Authorization (EUA).  This EUA will remain in effect (meaning this t est can be used) for the duration of  the COVID-19 declaration under Section 564(b)(1) of the Act, 21 U.S.C. section 360-bbb-3(b)(1), unless the authorization is terminated or revoked sooner.  Performed at [facility], [address] [place], [place]   Culture, blood (Routine X 2) w Reflex to ID Panel     Status: None   Collection Time: 03/24/2020  9:20 PM   Specimen: BLOOD  Result Value Ref Range Status   Specimen Description BLOOD BLOOD RIGHT FOREARM  Final   Special Requests   Final    BOTTLES DRAWN AEROBIC AND ANAEROBIC Blood Culture adequate volume   Culture   Final    NO GROWTH 5 DAYS Performed at [facility], [address] [place], [place]    Report Status 04/06/2020 FINAL  Final  Culture, blood (Routine X 2) w Reflex to ID Panel     Status:  None   Collection Time: 03/20/2020  9:31 PM   Specimen: BLOOD  Result Value Ref Range Status   Specimen Description BLOOD RIGHT ANTECUBITAL  Final   Special Requests   Final    BOTTLES DRAWN AEROBIC AND ANAEROBIC  Blood Culture results may not be optimal due to an excessive volume of blood received in culture bottles   Culture   Final    NO GROWTH 5 DAYS Performed at [facility], [address] [place] [address] [place], [place] [address]    Report Status 04/06/2020 FINAL  Final  MRSA PCR Screening     Status: None   Collection Time: 04/02/20 10:00 AM   Specimen: Nasal Mucosa; Nasopharyngeal  Result Value Ref Range Status   MRSA by PCR NEGATIVE NEGATIVE Final    Comment:        The GeneXpert MRSA Assay (FDA approved for NASAL specimens only), is one component of a comprehensive MRSA colonization surveillance program. It is not intended to diagnose MRSA infection nor to guide or monitor treatment for MRSA infections. Performed at [facility], [address], [place], [place] [address]      Studies: [facility] Chest [place] 1 View  Result Date: 04/10/2020 CLINICAL DATA:  Shortness of breath. EXAM: PORTABLE CHEST 1 VIEW COMPARISON:  04-21-2020. FINDINGS: Stable cardiomediastinal silhouette. No pneumothorax or pleural effusion is noted. Stable bibasilar lung opacities are noted concerning for pneumonia or possibly infiltrates. However, there may be pneumomediastinum present at this time. CT scan is recommended for further evaluation. Bony thorax is unremarkable. IMPRESSION: Stable bibasilar lung opacities are noted concerning for pneumonia or possibly infiltrates. However, there may be pneumomediastinum present at this time. CT scan of the chest is recommended for further evaluation. These results will be called to the ordering clinician or representative by the Radiologist Assistant, and communication documented in the PACS or zVision Dashboard. Electronically Signed   By: Lupita Raider  M.D.   On: 04/10/2020 08:29    Scheduled Meds: . apixaban  5 mg Oral BID  . vitamin C  500 mg Oral Daily  . aspirin EC  325 mg Oral Daily  . baricitinib  4 mg Oral Daily  . chlorhexidine  15 mL Mouth Rinse BID  . Chlorhexidine Gluconate Cloth  6 each Topical Daily  . cholecalciferol  1,000 Units Oral Daily  . diltiazem  180 mg Oral Q12H  . famotidine  20 mg Oral BID  . feeding supplement (ENSURE ENLIVE)  237 mL Oral TID BM  . guaiFENesin  600 mg Oral BID  . insulin aspart  0-5 Units Subcutaneous QHS  . insulin aspart  0-6 Units Subcutaneous TID WC  . mouth rinse  15 mL Mouth Rinse q12n4p  . methylPREDNISolone (SOLU-MEDROL) injection  40 mg Intravenous Q12H  . nystatin  5 mL Oral QID  . zinc sulfate  220 mg Oral Daily   Continuous Infusions: . acetylcysteine 15 mg/kg/hr (04/11/20 1218)    Assessment/Plan:  1. Acute hypoxic respiratory failure secondary to COVID-19 infection and pneumonia.  Patient on high flow nasal cannula 100% nonrebreather.  Oxygen support.  Prone positioning. 2. Multi lobar pneumonia secondary to COVID-19 virus.  Send off procalcitonin.  Continue Solu-Medrol.  Finished remdesivir. Continue baricitinib which was started 8/23.  Critical care specialist started acetylcysteine. 3. Chronic atrial fibrillation on diltiazem and Eliquis for anticoagulation. 4. Pneumomediastinum.  I ordered a CT scan of the chest but not unclear if he will be able to get this test with the amount of oxygen that he is on. 5. Thrombocytopenia.  Platelet count in the normal range 6. LFTs normalized.  Albumin and total protein on the lower side. 7. Weakness.  We will get physical therapy evaluation   Code Status:  Code Status Orders  (From admission, onward)         Start     Ordered   04/08/2020 2022  Full code  Continuous        03/31/2020 2025        Code Status History    This patient has a current code status but no historical code status.   Advance Care Planning  Activity     Family Communication: Spoke with the patient's wife on the phone Disposition Plan: Status is: Inpatient  Dispo: The patient is from: Home              Anticipated d/c is to: Home              Anticipated d/c date is: Cannot determined anticipated discharge date secondary to the patient being on way too much oxygen at this time.              Patient currently on high flow nasal cannula and 100% nonrebreather when I saw him this morning.  Consultants:  Critical care specialist  Time spent: 28 minutes  [employer]ia [employer]  [facility]

## 2020-04-11 NOTE — Progress Notes (Signed)
Follow up - Critical Care Medicine Note  Patient Details:    Johnny Shepard is an 69 y.o. male withPMH atrial fibrillation presented with shortness of breath, recent outpatient diagnosis with Covid. Admitted for acute hypoxic respiratory failure secondary to Covid multifocal pneumonia, admitted to stepdown, PCCM consulting.  Lines, Airways, Drains:    Anti-infectives:  Anti-infectives (From admission, onward)   Start     Dose/Rate Route Frequency Ordered Stop   04/08/20 0930  ivermectin (STROMECTOL) tablet 12,000 mcg        150 mcg/kg  84.7 kg Oral  Once 04/08/20 0844 04/08/20 1246   04/02/20 1000  remdesivir 100 mg in sodium chloride 0.9 % 100 mL IVPB       "Followed by" Linked Group Details   100 mg 200 mL/hr over 30 Minutes Intravenous Daily 04-21-20 2025 04/05/20 1947   04/21/20 2200  remdesivir 200 mg in sodium chloride 0.9% 250 mL IVPB       "Followed by" Linked Group Details   200 mg 580 mL/hr over 30 Minutes Intravenous Once Apr 21, 2020 2025 04/02/20 0015    Medications reviewed with Pharm.D.   Microbiology: Results for orders placed or performed during the hospital encounter of 2020-04-21  SARS Coronavirus 2 by RT PCR (hospital order, performed in [facility] hospital lab) Nasopharyngeal Nasopharyngeal Swab     Status: Abnormal   Collection Time: 2020/04/21  7:53 PM   Specimen: Nasopharyngeal Swab  Result Value Ref Range Status   SARS Coronavirus 2 POSITIVE (A) NEGATIVE Final    Comment: RESULT CALLED TO, READ BACK BY AND VERIFIED WITH: LEA FERGUISON 04-21-20 AT 2115 BY AR (NOTE) SARS-CoV-2 target nucleic acids are DETECTED  SARS-CoV-2 RNA is generally detectable in upper respiratory specimens  during the acute phase of infection.  Positive results are indicative  of the presence of the identified virus, but do not rule out bacterial infection or co-infection with other pathogens not detected by the test.  Clinical correlation with patient history and  other diagnostic  information is necessary to determine patient infection status.  The expected result is negative.  Fact Sheet for Patients:   BoilerBrush.com.cy   Fact Sheet for Healthcare Providers:   https://pope.com/    This test is not yet approved or cleared by the [place] FDA and  has been authorized for detection and/or diagnosis of SARS-CoV-2 by FDA under an Emergency Use Authorization (EUA).  This EUA will remain in effect (meaning this t est can be used) for the duration of  the COVID-19 declaration under Section 564(b)(1) of the Act, 21 U.S.C. section 360-bbb-3(b)(1), unless the authorization is terminated or revoked sooner.  Performed at [facility], [address] [place], [place] [address]   Culture, blood (Routine X 2) w Reflex to ID Panel     Status: None   Collection Time: 04/21/2020  9:20 PM   Specimen: BLOOD  Result Value Ref Range Status   Specimen Description BLOOD BLOOD RIGHT FOREARM  Final   Special Requests   Final    BOTTLES DRAWN AEROBIC AND ANAEROBIC Blood Culture adequate volume   Culture   Final    NO GROWTH 5 DAYS Performed at [facility], [address], [place], [place] [address]    Report Status 04/06/2020 FINAL  Final  Culture, blood (Routine X 2) w Reflex to ID Panel     Status: None   Collection Time: 2020-04-21  9:31 PM   Specimen: BLOOD  Result Value Ref Range Status  Specimen Description BLOOD RIGHT ANTECUBITAL  Final   Special Requests   Final    BOTTLES DRAWN AEROBIC AND ANAEROBIC Blood Culture results may not be optimal due to an excessive volume of blood received in culture bottles   Culture   Final    NO GROWTH 5 DAYS Performed at [facility], [address] [place], [place] [address]    Report Status 04/06/2020 FINAL  Final  MRSA PCR Screening     Status: None   Collection Time: 04/02/20 10:00 AM   Specimen: Nasal Mucosa; Nasopharyngeal  Result Value Ref  Range Status   MRSA by PCR NEGATIVE NEGATIVE Final    Comment:        The GeneXpert MRSA Assay (FDA approved for NASAL specimens only), is one component of a comprehensive MRSA colonization surveillance program. It is not intended to diagnose MRSA infection nor to guide or monitor treatment for MRSA infections. Performed at [facility], [address] [place], [place] [address]    Laboratory data reviewed.   Best Practice/Protocols:  VTE Prophylaxis: Direct Thrombin Inhibitor GI Prophylaxis: Antihistamine   Events: 04/07/20- no overnight events patient verbalizes in full sentences, relates clinical improvement. 04/08/20- patient in good spirits , he is still on elevated settings with HFNC.Encourage use of IS. 8/30- Requiring HFNC + NRB,no conversational dyspnea. 8/31-pneumomediastinum noted on chest x-ray, acetylcysteine infusion 5-day protocol started. 9/1- patient continues to require elevated settings on high flow and desaturates with urination and any minimal activity.   Studies: US Venous Img Lower Bilateral (DVT)  Result Date: 04/05/2020 CLINICAL DATA:  Bilateral lower extremity pain and edema. Elevated D-dimer. Patient is currently on anticoagulation. Evaluate for DVT. EXAM: BILATERAL LOWER EXTREMITY VENOUS DOPPLER ULTRASOUND TECHNIQUE: Gray-scale sonography with graded compression, as well as color Doppler and duplex ultrasound were performed to evaluate the lower extremity deep venous systems from the level of the common femoral vein and including the common femoral, femoral, profunda femoral, popliteal and calf veins including the posterior tibial, peroneal and gastrocnemius veins when visible. The superficial great saphenous vein was also interrogated. Spectral Doppler was utilized to evaluate flow at rest and with distal augmentation maneuvers in the common femoral, femoral and popliteal veins. COMPARISON:  None. FINDINGS: RIGHT LOWER EXTREMITY Common Femoral  Vein: No evidence of thrombus. Normal compressibility, respiratory phasicity and response to augmentation. Saphenofemoral Junction: No evidence of thrombus. Normal compressibility and flow on color Doppler imaging. Profunda Femoral Vein: No evidence of thrombus. Normal compressibility and flow on color Doppler imaging. Femoral Vein: No evidence of thrombus. Normal compressibility, respiratory phasicity and response to augmentation. Popliteal Vein: No evidence of thrombus. Normal compressibility, respiratory phasicity and response to augmentation. Calf Veins: No evidence of thrombus. Normal compressibility and flow on color Doppler imaging. Superficial Great Saphenous Vein: No evidence of thrombus. Normal compressibility. Venous Reflux:  None. Other Findings:  None. LEFT LOWER EXTREMITY Common Femoral Vein: No evidence of thrombus. Normal compressibility, respiratory phasicity and response to augmentation. Saphenofemoral Junction: No evidence of thrombus. Normal compressibility and flow on color Doppler imaging. Profunda Femoral Vein: No evidence of thrombus. Normal compressibility and flow on color Doppler imaging. Femoral Vein: No evidence of thrombus. Normal compressibility, respiratory phasicity and response to augmentation. Popliteal Vein: No evidence of thrombus. Normal compressibility, respiratory phasicity and response to augmentation. Calf Veins: No evidence of thrombus. Normal compressibility and flow on color Doppler imaging. Superficial Great Saphenous Vein: No evidence of thrombus. Normal compressibility. Venous Reflux:  None. Other Findings:  None.  IMPRESSION: No evidence of DVT within either lower extremity. Electronically Signed   By: Simonne Come M.D.   On: 04/05/2020 07:34   DG Chest Port 1 View  Result Date: 04/10/2020 CLINICAL DATA:  Shortness of breath. EXAM: PORTABLE CHEST 1 VIEW COMPARISON:  04/02/20. FINDINGS: Stable cardiomediastinal silhouette. No pneumothorax or pleural effusion  is noted. Stable bibasilar lung opacities are noted concerning for pneumonia or possibly infiltrates. However, there may be pneumomediastinum present at this time. CT scan is recommended for further evaluation. Bony thorax is unremarkable. IMPRESSION: Stable bibasilar lung opacities are noted concerning for pneumonia or possibly infiltrates. However, there may be pneumomediastinum present at this time. CT scan of the chest is recommended for further evaluation. These results will be called to the ordering clinician or representative by the Radiologist Assistant, and communication documented in the PACS or zVision Dashboard. Electronically Signed   By: Lupita Raider M.D.   On: 04/10/2020 08:29   DG Chest Portable 1 View  Result Date: 2020/04/02 CLINICAL DATA:  Dyspnea EXAM: PORTABLE CHEST 1 VIEW COMPARISON:  None. FINDINGS: The lungs are well expanded and are symmetric. Minimal patchy pulmonary infiltrate may be present at the left lung base and within the right mid lung zone, possibly infectious or inflammatory in the acute setting. There is no pneumothorax or pleural effusion. Cardiac size within normal limits. No acute bone abnormality. IMPRESSION: Minimal patchy pulmonary infiltrate may be present at the left lung base and within the right mid lung zone, possibly infectious or inflammatory in the acute setting. Electronically Signed   By: Helyn Numbers MD   On: 04-02-20 19:39    Consults: Treatment Team:  Vida Rigger, MD Pccm, [facility], MD   Subjective:    Overnight Issues: Remains with high FiO2 requirement.  No conversational dyspnea.  In good spirits.  Doing self proning as tolerated.  Using incentive spirometer and Acapella.  Small pneumomediastinum noted on chest x-ray, he has no subcu emphysema.  Objective:  Vital signs for last 24 hours: Temp:  [97 F (36.1 C)-98.2 F (36.8 C)] 97 F (36.1 C) (09/01 1200) Pulse Rate:  [34-129] 129 (09/01 1200) Resp:  [9-23] 21 (09/01  1200) BP: (87-132)/(63-109) 122/84 (09/01 1200) SpO2:  [84 %-99 %] 91 % (09/01 1200) FiO2 (%):  [100 %] 100 % (09/01 0839)  Hemodynamic parameters for last 24 hours:    Intake/Output from previous day: 08/31 0701 - 09/01 0700 In: 320 [P.O.:320] Out: 1550 [Urine:1550]  Intake/Output this shift: Total I/O In: 568.5 [I.V.:568.5] Out: 520 [Urine:520]  Vent settings for last 24 hours: FiO2 (%):  [100 %] 100 %  Physical Exam:   GENERAL: Awake, alert, no conversational dyspnea.  On high flow nasal cannula, intermittent use of nonrebreather as well. HEAD: Normocephalic, atraumatic.  EYES: Pupils equal, round, reactive to light.  No scleral icterus.  MOUTH: Oral mucosa moist, no thrush. NECK: Supple.  No crepitus. Trachea midline. No JVD.  No adenopathy. PULMONARY: Good air entry bilaterally.  Coarse.  CARDIOVASCULAR: Monitor shows atrial fibrillation in good control. ABDOMEN: Benign. MUSCULOSKELETAL: No joint deformity, no clubbing, no edema.  NEUROLOGIC: No focal deficits, speech is fluent. SKIN: Intact,warm,dry.  No rashes. PSYCH: Mood and behavior appropriate.  Assessment/Plan:  Acute respiratory failure with hypoxia due to COVID-19 COVID19 pneumonia -Remdesevir antiviral - pharmacy protocol 5 d -Baricitinib as Actemra on shortage -vitamin C -zinc -Solu-Medrol 40 mg every 12 -Diuresis -as needed and tolerated -Self proning as tolerated -Patient using IS and Acapella  device for bronchopulmonary hygiene  -d/c hepatotoxic medications while on remdesevir -supportive care with SDU telemetry monitoring -PT/OT when possible -procalcitonin, CRP and ferritin trending  Pneumomediastinum Has been observed with delta variant of COVID-19 Nothing to do for now Follow chest x-ray as needed  Atrial fibrillation  -on eliquis and cardizem -rate controlled   ID  -Currently no evidence of bacterial infection -follow up cultures -Monitor WBC, trend  procalcitonin  GI/Nutrition Good p.o. intake  ENDO -ICU hypoglycemic\Hyperglycemia protocol -check FSBS per protocol   ELECTROLYTES -follow labs as needed -replace as needed -pharmacy consultation    LOS: 10 days   Additional comments:Discussed during multidisciplinary rounds.  Care coordination with bedside nurse was done.  Discussed with hospitalist.   Critical Care Total Time*: 33   Vida Rigger, M.D.  Pulmonary & Critical Care Medicine  [facility]     *Care during the described time interval was provided by me and/or other providers on the critical care team.  I have reviewed this patient's available data, including medical history, events of note, physical examination and test results as part of my evaluation.  **This note was dictated using voice recognition software/Dragon.  Despite best efforts to proofread, errors can occur which can change the meaning.  Any change was purely unintentional.

## 2020-04-11 NOTE — Progress Notes (Signed)
Patient lying prone in bed at this time. Will continue to monitor.

## 2020-04-11 DEATH — deceased

## 2020-04-12 DIAGNOSIS — R531 Weakness: Secondary | ICD-10-CM

## 2020-04-12 LAB — CBC WITH DIFFERENTIAL/PLATELET
Abs Immature Granulocytes: 0.18 10*3/uL — ABNORMAL HIGH (ref 0.00–0.07)
Basophils Absolute: 0 10*3/uL (ref 0.0–0.1)
Basophils Relative: 0 %
Eosinophils Absolute: 0 10*3/uL (ref 0.0–0.5)
Eosinophils Relative: 0 %
HCT: 35.8 % — ABNORMAL LOW (ref 39.0–52.0)
Hemoglobin: 12.3 g/dL — ABNORMAL LOW (ref 13.0–17.0)
Immature Granulocytes: 1 %
Lymphocytes Relative: 3 %
Lymphs Abs: 0.5 10*3/uL — ABNORMAL LOW (ref 0.7–4.0)
MCH: 29.9 pg (ref 26.0–34.0)
MCHC: 34.4 g/dL (ref 30.0–36.0)
MCV: 87.1 fL (ref 80.0–100.0)
Monocytes Absolute: 0.5 10*3/uL (ref 0.1–1.0)
Monocytes Relative: 4 %
Neutro Abs: 13 10*3/uL — ABNORMAL HIGH (ref 1.7–7.7)
Neutrophils Relative %: 92 %
Platelets: 162 10*3/uL (ref 150–400)
RBC: 4.11 MIL/uL — ABNORMAL LOW (ref 4.22–5.81)
RDW: 13.6 % (ref 11.5–15.5)
WBC: 14.2 10*3/uL — ABNORMAL HIGH (ref 4.0–10.5)
nRBC: 0 % (ref 0.0–0.2)

## 2020-04-12 LAB — FIBRIN DERIVATIVES D-DIMER (ARMC ONLY): Fibrin derivatives D-dimer (ARMC): >7500 ng/mL (FEU) — ABNORMAL HIGH (ref 0.00–499.00)

## 2020-04-12 LAB — COMPREHENSIVE METABOLIC PANEL
ALT: 41 U/L (ref 0–44)
AST: 24 U/L (ref 15–41)
Albumin: 2 g/dL — ABNORMAL LOW (ref 3.5–5.0)
Alkaline Phosphatase: 52 U/L (ref 38–126)
Anion gap: 9 (ref 5–15)
BUN: 46 mg/dL — ABNORMAL HIGH (ref 8–23)
CO2: 30 mmol/L (ref 22–32)
Calcium: 8.2 mg/dL — ABNORMAL LOW (ref 8.9–10.3)
Chloride: 94 mmol/L — ABNORMAL LOW (ref 98–111)
Creatinine, Ser: 0.9 mg/dL (ref 0.61–1.24)
GFR calc Af Amer: >60 mL/min (ref >60)
GFR calc non Af Amer: >60 mL/min (ref >60)
Glucose, Bld: 193 mg/dL — ABNORMAL HIGH (ref 70–99)
Potassium: 4.3 mmol/L (ref 3.5–5.1)
Sodium: 133 mmol/L — ABNORMAL LOW (ref 135–145)
Total Bilirubin: 1.1 mg/dL (ref 0.3–1.2)
Total Protein: 5.2 g/dL — ABNORMAL LOW (ref 6.5–8.1)

## 2020-04-12 LAB — GLUCOSE, CAPILLARY
Glucose-Capillary: 172 mg/dL — ABNORMAL HIGH (ref 70–99)
Glucose-Capillary: 174 mg/dL — ABNORMAL HIGH (ref 70–99)
Glucose-Capillary: 266 mg/dL — ABNORMAL HIGH (ref 70–99)
Glucose-Capillary: 297 mg/dL — ABNORMAL HIGH (ref 70–99)
Glucose-Capillary: 210 mg/dL — ABNORMAL HIGH (ref 70–99)

## 2020-04-12 LAB — PROCALCITONIN: Procalcitonin: <0.1 ng/mL

## 2020-04-12 LAB — C-REACTIVE PROTEIN: CRP: 0.8 mg/dL (ref <1.0)

## 2020-04-12 LAB — FERRITIN: Ferritin: 1395 ng/mL — ABNORMAL HIGH (ref 24–336)

## 2020-04-12 LAB — PHOSPHORUS: Phosphorus: 3.2 mg/dL (ref 2.5–4.6)

## 2020-04-12 NOTE — Progress Notes (Addendum)
Patient ID: Johnny Shepard, male   DOB: May 07, 1951, 69 y.o.   MRN: [id number] [facility] PROGRESS NOTE  Johnny Shepard QZR:[id number] DOB: 1951/02/26 DOA: 03/20/2020 PCP: Patient, No Pcp Per  HPI/Subjective: Patient has a little chest tightness and some shortness of breath.  Patient breathing comfortably not working to breathe.  Came in with shortness of breath and found to have COVID-19 pneumonia.  Objective: Vitals:   04/12/20 1100 04/12/20 1200  BP: 120/75 (!) 123/91  Pulse: 90 66  Resp: (!) 24 16  Temp:    SpO2: 96% 97%    Intake/Output Summary (Last 24 hours) at 04/12/2020 1322 Last data filed at 04/12/2020 0700 Gross per 24 hour  Intake 158.95 ml  Output 1250 ml  Net -1091.05 ml   Filed Weights   03/15/2020 1852 04/02/20 0915  Weight: 79 kg 84.7 kg    ROS: Review of Systems  Respiratory: Positive for shortness of breath. Negative for cough.   Cardiovascular: Positive for chest pain.  Gastrointestinal: Negative for abdominal pain, nausea and vomiting.   Exam: Physical Exam HENT:     Nose: No mucosal edema.     Mouth/Throat:     Pharynx: No oropharyngeal exudate.  Eyes:     General: Lids are normal.     Conjunctiva/sclera: Conjunctivae normal.     Pupils: Pupils are equal, round, and reactive to light.  Cardiovascular:     Rate and Rhythm: Normal rate. Rhythm irregularly irregular.     Heart sounds: Normal heart sounds, S1 normal and S2 normal.  Pulmonary:     Breath sounds: Examination of the right-middle field reveals decreased breath sounds. Examination of the left-middle field reveals decreased breath sounds. Examination of the right-lower field reveals decreased breath sounds. Examination of the left-lower field reveals decreased breath sounds. Decreased breath sounds and wheezing present. No rhonchi or rales.  Abdominal:     Palpations: Abdomen is soft.     Tenderness: There is no abdominal tenderness.  Musculoskeletal:     Right ankle: No swelling.      Left ankle: No swelling.  Skin:    General: Skin is warm.     Findings: No rash.  Neurological:     Mental Status: He is alert and oriented to person, place, and time.       Data Reviewed: Basic Metabolic Panel: Recent Labs  Lab 04/06/20 0439 04/07/20 0830 04/08/20 0517 04/09/20 0409 04/10/20 0523 04/11/20 0415 04/12/20 0432  NA  --    < > 134* 135 134* 133* 133*  K  --    < > 3.9 5.0 4.9 4.7 4.3  CL  --    < > 94* 96* 96* 94* 94*  CO2  --    < > 34* 31 31 30 30   GLUCOSE  --    < > 183* 155* 151* 205* 193*  BUN  --    < > 33* 32* 36* 41* 46*  CREATININE  --    < > 0.67 0.75 0.82 0.75 0.90  CALCIUM  --    < > 7.9* 8.1* 7.9* 8.4* 8.2*  MG 2.4  --   --   --   --  2.5*  --   PHOS  --   --   --   --   --  3.0 3.2   < > = values in this interval not displayed.   Liver Function Tests: Recent Labs  Lab 04/08/20 04/10/20 04/09/20 0409 04/10/20 0523 04/11/20 0415  04/12/20 0432  AST 24 30 27 24 24   ALT 44 44 44 43 41  ALKPHOS 46 52 50 55 52  BILITOT 1.2 1.1 1.2 0.9 1.1  PROT 5.3* 5.3* 5.2* 5.5* 5.2*  ALBUMIN 2.1* 2.1* 2.1* 2.2* 2.0*   CBC: Recent Labs  Lab 04/08/20 0517 04/09/20 0409 04/10/20 0523 04/11/20 0415 04/12/20 0432  WBC 12.5* 13.3* 15.3* 17.8* 14.2*  NEUTROABS 11.7* 12.5* 14.2* 16.5* 13.0*  HGB 12.8* 12.5* 12.3* 13.3 12.3*  HCT 35.8* 36.5* 35.7* 36.9* 35.8*  MCV 85.9 87.7 89.0 84.4 87.1  PLT 251 233 208 204 162   BNP (last 3 results) Recent Labs    03/19/2020 2120  BNP 92.0     CBG: Recent Labs  Lab 04/11/20 1152 04/11/20 1643 04/11/20 2103 04/12/20 0730 04/12/20 1154  GLUCAP 208* 189* 178* 172* 174*    Scheduled Meds: . apixaban  5 mg Oral BID  . vitamin C  500 mg Oral Daily  . aspirin EC  325 mg Oral Daily  . baricitinib  4 mg Oral Daily  . chlorhexidine  15 mL Mouth Rinse BID  . Chlorhexidine Gluconate Cloth  6 each Topical Daily  . cholecalciferol  1,000 Units Oral Daily  . diltiazem  180 mg Oral Q12H  . famotidine  20 mg Oral  BID  . feeding supplement (ENSURE ENLIVE)  237 mL Oral TID BM  . guaiFENesin  600 mg Oral BID  . insulin aspart  0-5 Units Subcutaneous QHS  . insulin aspart  0-6 Units Subcutaneous TID WC  . mouth rinse  15 mL Mouth Rinse q12n4p  . methylPREDNISolone (SOLU-MEDROL) injection  40 mg Intravenous Q12H  . nystatin  5 mL Oral QID  . zinc sulfate  220 mg Oral Daily   Continuous Infusions: . acetylcysteine 15 mg/kg/hr (04/12/20 0739)    Assessment/Plan:  1. Acute hypoxic respiratory failure secondary to COVID-19 infection and pneumonia.  Patient on high flow nasal cannula 45 L and 100% nonrebreather.  I tried taking off the 100% nonrebreather and he desaturated into the 70s.  Desaturates with minimal movement.  Prone positioning.  Case discussed with critical care specialist to get a CT scan of the chest. 2. Multi lobar pneumonia secondary to COVID-19 virus.  Procalcitonin negative.  Continue Solu-Medrol.  Patient finished remdesivir.  Continue baricitinib (started 8/23).  On acetylcysteine as per critical care specialist. 3. Chronic atrial fibrillation on diltiazem and Eliquis for anticoagulation 4. Pneumomediastinum.  As per critical care specialist supportive care. 5. Previous thrombocytopenia 6. Previous elevated liver function tests 7. Weakness.  Continue leg exercises in the bed and in chair.  Prone positioning.     Code Status:     Code Status Orders  (From admission, onward)         Start     Ordered   03/16/2020 2022  Full code  Continuous        04/07/2020 2025        Code Status History    This patient has a current code status but no historical code status.   Advance Care Planning Activity     Family Communication: Spoke with wife on the phone Disposition Plan: Status is: Inpatient  Dispo: The patient is from: Home              Anticipated d/c is to: Home              Anticipated d/c date is: On too much oxygen even to think about  discharge date at this point               Patient currently on high flow nasal cannula +100% nonrebreather.  Consultants:  Critical care specialist  Time spent: 28 minutes.  Case discussed with charge nurse because patient's wife was wondering if she can visit at this point.  She will check with infection control on visitation policy with Covid infection.  Patient was diagnosed on 18 August.  Alford Highland  [facility]

## 2020-04-12 NOTE — Progress Notes (Signed)
RN and NT helped pt to prone position.

## 2020-04-12 NOTE — Progress Notes (Signed)
PT Cancellation Note  Patient Details Name: Johnny Shepard MRN: [id number] DOB: [birth year]   Cancelled Treatment:    Reason Eval/Treat Not Completed: Medical issues which prohibited therapy Chart reviewed, spoke with nursing about possibly seeing pt today.  She hoped he could be seeing, but on attempt later in the AM he was in prone and she reports that he was having a rough day.  Will hold this AM and likely today, but maintain on caseload and attempt to see when appropriate.   Malachi Pro, DPT 04/12/2020, 11:28 AM

## 2020-04-12 NOTE — Progress Notes (Signed)
Follow up - Critical Care Medicine Note  Patient Details:    Johnny Shepard is an 69 y.o. male withPMH atrial fibrillation presented with shortness of breath, recent outpatient diagnosis with Covid. Admitted for acute hypoxic respiratory failure secondary to Covid multifocal pneumonia, admitted to stepdown, PCCM consulting.  04/12/20- patient continues to require elevated setting on HFNC.  Patient on Eliquis some concern for PE due to no improvement for 10 days despite awake alert lucid status, discussed with Dr Renae Gloss will perform CTPE protocol  Lines, Airways, Drains:    Anti-infectives:  Anti-infectives (From admission, onward)   Start     Dose/Rate Route Frequency Ordered Stop   04/08/20 0930  ivermectin (STROMECTOL) tablet 12,000 mcg        150 mcg/kg  84.7 kg Oral  Once 04/08/20 0844 04/08/20 1246   04/02/20 1000  remdesivir 100 mg in sodium chloride 0.9 % 100 mL IVPB       "Followed by" Linked Group Details   100 mg 200 mL/hr over 30 Minutes Intravenous Daily 03/16/2020 2025 04/05/20 1947   03/29/2020 2200  remdesivir 200 mg in sodium chloride 0.9% 250 mL IVPB       "Followed by" Linked Group Details   200 mg 580 mL/hr over 30 Minutes Intravenous Once 03/27/2020 2025 04/02/20 0015    Medications reviewed with Pharm.D.   Microbiology: Results for orders placed or performed during the hospital encounter of 04/09/2020  SARS Coronavirus 2 by RT PCR (hospital order, performed in [facility] hospital lab) Nasopharyngeal Nasopharyngeal Swab     Status: Abnormal   Collection Time: 03/22/2020  7:53 PM   Specimen: Nasopharyngeal Swab  Result Value Ref Range Status   SARS Coronavirus 2 POSITIVE (A) NEGATIVE Final    Comment: RESULT CALLED TO, READ BACK BY AND VERIFIED WITH: LEA FERGUISON 04/08/2020 AT 2115 BY AR (NOTE) SARS-CoV-2 target nucleic acids are DETECTED  SARS-CoV-2 RNA is generally detectable in upper respiratory specimens  during the acute phase of infection.  Positive results are  indicative  of the presence of the identified virus, but do not rule out bacterial infection or co-infection with other pathogens not detected by the test.  Clinical correlation with patient history and  other diagnostic information is necessary to determine patient infection status.  The expected result is negative.  Fact Sheet for Patients:   BoilerBrush.com.cy   Fact Sheet for Healthcare Providers:   https://pope.com/    This test is not yet approved or cleared by the [place] FDA and  has been authorized for detection and/or diagnosis of SARS-CoV-2 by FDA under an Emergency Use Authorization (EUA).  This EUA will remain in effect (meaning this t est can be used) for the duration of  the COVID-19 declaration under Section 564(b)(1) of the Act, 21 U.S.C. section 360-bbb-3(b)(1), unless the authorization is terminated or revoked sooner.  Performed at [facility], [address] [place], [place] [address]   Culture, blood (Routine X 2) w Reflex to ID Panel     Status: None   Collection Time: 04/10/2020  9:20 PM   Specimen: BLOOD  Result Value Ref Range Status   Specimen Description BLOOD BLOOD RIGHT FOREARM  Final   Special Requests   Final    BOTTLES DRAWN AEROBIC AND ANAEROBIC Blood Culture adequate volume   Culture   Final    NO GROWTH 5 DAYS Performed at [facility], [address], [place], [place] [address]    Report Status 04/06/2020  FINAL  Final  Culture, blood (Routine X 2) w Reflex to ID Panel     Status: None   Collection Time: 03/28/2020  9:31 PM   Specimen: BLOOD  Result Value Ref Range Status   Specimen Description BLOOD RIGHT ANTECUBITAL  Final   Special Requests   Final    BOTTLES DRAWN AEROBIC AND ANAEROBIC Blood Culture results may not be optimal due to an excessive volume of blood received in culture bottles   Culture   Final    NO GROWTH 5 DAYS Performed at [facility],  [address] [place], [place] [address]    Report Status 04/06/2020 FINAL  Final  MRSA PCR Screening     Status: None   Collection Time: 04/02/20 10:00 AM   Specimen: Nasal Mucosa; Nasopharyngeal  Result Value Ref Range Status   MRSA by PCR NEGATIVE NEGATIVE Final    Comment:        The GeneXpert MRSA Assay (FDA approved for NASAL specimens only), is one component of a comprehensive MRSA colonization surveillance program. It is not intended to diagnose MRSA infection nor to guide or monitor treatment for MRSA infections. Performed at [facility], [address] [place], [place] [address]    Laboratory data reviewed.   Best Practice/Protocols:  VTE Prophylaxis: Direct Thrombin Inhibitor GI Prophylaxis: Antihistamine   Events: 04/07/20- no overnight events patient verbalizes in full sentences, relates clinical improvement. 04/08/20- patient in good spirits , he is still on elevated settings with HFNC.Encourage use of IS. 8/30- Requiring HFNC + NRB,no conversational dyspnea. 8/31-pneumomediastinum noted on chest x-ray, acetylcysteine infusion 5-day protocol started. 9/1- patient continues to require elevated settings on high flow and desaturates with urination and any minimal activity.   Studies: US Venous Img Lower Bilateral (DVT)  Result Date: 04/05/2020 CLINICAL DATA:  Bilateral lower extremity pain and edema. Elevated D-dimer. Patient is currently on anticoagulation. Evaluate for DVT. EXAM: BILATERAL LOWER EXTREMITY VENOUS DOPPLER ULTRASOUND TECHNIQUE: Gray-scale sonography with graded compression, as well as color Doppler and duplex ultrasound were performed to evaluate the lower extremity deep venous systems from the level of the common femoral vein and including the common femoral, femoral, profunda femoral, popliteal and calf veins including the posterior tibial, peroneal and gastrocnemius veins when visible. The superficial great saphenous vein was also  interrogated. Spectral Doppler was utilized to evaluate flow at rest and with distal augmentation maneuvers in the common femoral, femoral and popliteal veins. COMPARISON:  None. FINDINGS: RIGHT LOWER EXTREMITY Common Femoral Vein: No evidence of thrombus. Normal compressibility, respiratory phasicity and response to augmentation. Saphenofemoral Junction: No evidence of thrombus. Normal compressibility and flow on color Doppler imaging. Profunda Femoral Vein: No evidence of thrombus. Normal compressibility and flow on color Doppler imaging. Femoral Vein: No evidence of thrombus. Normal compressibility, respiratory phasicity and response to augmentation. Popliteal Vein: No evidence of thrombus. Normal compressibility, respiratory phasicity and response to augmentation. Calf Veins: No evidence of thrombus. Normal compressibility and flow on color Doppler imaging. Superficial Great Saphenous Vein: No evidence of thrombus. Normal compressibility. Venous Reflux:  None. Other Findings:  None. LEFT LOWER EXTREMITY Common Femoral Vein: No evidence of thrombus. Normal compressibility, respiratory phasicity and response to augmentation. Saphenofemoral Junction: No evidence of thrombus. Normal compressibility and flow on color Doppler imaging. Profunda Femoral Vein: No evidence of thrombus. Normal compressibility and flow on color Doppler imaging. Femoral Vein: No evidence of thrombus. Normal compressibility, respiratory phasicity and response to augmentation. Popliteal Vein: No evidence of thrombus.  Normal compressibility, respiratory phasicity and response to augmentation. Calf Veins: No evidence of thrombus. Normal compressibility and flow on color Doppler imaging. Superficial Great Saphenous Vein: No evidence of thrombus. Normal compressibility. Venous Reflux:  None. Other Findings:  None. IMPRESSION: No evidence of DVT within either lower extremity. Electronically Signed   By: Simonne Come M.D.   On: 04/05/2020 07:34    DG Chest Port 1 View  Result Date: 04/10/2020 CLINICAL DATA:  Shortness of breath. EXAM: PORTABLE CHEST 1 VIEW COMPARISON:  04-03-20. FINDINGS: Stable cardiomediastinal silhouette. No pneumothorax or pleural effusion is noted. Stable bibasilar lung opacities are noted concerning for pneumonia or possibly infiltrates. However, there may be pneumomediastinum present at this time. CT scan is recommended for further evaluation. Bony thorax is unremarkable. IMPRESSION: Stable bibasilar lung opacities are noted concerning for pneumonia or possibly infiltrates. However, there may be pneumomediastinum present at this time. CT scan of the chest is recommended for further evaluation. These results will be called to the ordering clinician or representative by the Radiologist Assistant, and communication documented in the PACS or zVision Dashboard. Electronically Signed   By: Lupita Raider M.D.   On: 04/10/2020 08:29   DG Chest Portable 1 View  Result Date: 04-03-2020 CLINICAL DATA:  Dyspnea EXAM: PORTABLE CHEST 1 VIEW COMPARISON:  None. FINDINGS: The lungs are well expanded and are symmetric. Minimal patchy pulmonary infiltrate may be present at the left lung base and within the right mid lung zone, possibly infectious or inflammatory in the acute setting. There is no pneumothorax or pleural effusion. Cardiac size within normal limits. No acute bone abnormality. IMPRESSION: Minimal patchy pulmonary infiltrate may be present at the left lung base and within the right mid lung zone, possibly infectious or inflammatory in the acute setting. Electronically Signed   By: Helyn Numbers MD   On: 2020/04/03 19:39    Consults: Treatment Team:  Vida Rigger, MD [facility], [facility], MD   Subjective:    Overnight Issues: Remains with high FiO2 requirement.  No conversational dyspnea.  In good spirits.  Doing self proning as tolerated.  Using incentive spirometer and Acapella.  Small pneumomediastinum noted  on chest x-ray, he has no subcu emphysema.  Objective:  Vital signs for last 24 hours: Temp:  [97 F (36.1 C)-98.1 F (36.7 C)] 97 F (36.1 C) (09/02 0745) Pulse Rate:  [54-90] 74 (09/02 1400) Resp:  [14-24] 17 (09/02 1400) BP: (113-127)/(65-102) 113/73 (09/02 1400) SpO2:  [84 %-100 %] 85 % (09/02 1629) FiO2 (%):  [88 %] 88 % (09/02 1629)  Hemodynamic parameters for last 24 hours:    Intake/Output from previous day: 09/01 0701 - 09/02 0700 In: 755.6 [I.V.:755.6] Out: 1770 [Urine:1770]  Intake/Output this shift: Total I/O In: -  Out: 550 [Urine:550]  Vent settings for last 24 hours: FiO2 (%):  [88 %] 88 %  Physical Exam:   GENERAL: Awake, alert, no conversational dyspnea.  On high flow nasal cannula, intermittent use of nonrebreather as well. HEAD: Normocephalic, atraumatic.  EYES: Pupils equal, round, reactive to light.  No scleral icterus.  MOUTH: Oral mucosa moist, no thrush. NECK: Supple.  No crepitus. Trachea midline. No JVD.  No adenopathy. PULMONARY: Good air entry bilaterally.  Coarse.  CARDIOVASCULAR: Monitor shows atrial fibrillation in good control. ABDOMEN: Benign. MUSCULOSKELETAL: No joint deformity, no clubbing, no edema.  NEUROLOGIC: No focal deficits, speech is fluent. SKIN: Intact,warm,dry.  No rashes. PSYCH: Mood and behavior appropriate.  Assessment/Plan:  Acute respiratory failure with  hypoxia due to COVID-19 COVID19 pneumonia -Remdesevir antiviral - pharmacy protocol 5 d -Baricitinib as Actemra on shortage -vitamin C -zinc -Solu-Medrol 40 mg every 12 -Diuresis -as needed and tolerated -Self proning as tolerated -Patient using IS and Acapella device for bronchopulmonary hygiene  -d/c hepatotoxic medications while on remdesevir -supportive care with SDU telemetry monitoring -PT/OT when possible -procalcitonin, CRP and ferritin trending  Pneumomediastinum Has been observed with delta variant of COVID-19 Nothing to do for now Follow chest  x-ray as needed  Atrial fibrillation  -on eliquis and cardizem -rate controlled   ID  -Currently no evidence of bacterial infection -follow up cultures -Monitor WBC, trend procalcitonin  GI/Nutrition Good p.o. intake  ENDO -ICU hypoglycemic\Hyperglycemia protocol -check FSBS per protocol   ELECTROLYTES -follow labs as needed -replace as needed -pharmacy consultation    LOS: 11 days   Additional comments:Discussed during multidisciplinary rounds.  Care coordination with bedside nurse was done.  Discussed with hospitalist.   Critical Care Total Time*: 33   Vida Rigger, M.D.  Pulmonary & Critical Care Medicine  [facility]     *Care during the described time interval was provided by me and/or other providers on the critical care team.  I have reviewed this patient's available data, including medical history, events of note, physical examination and test results as part of my evaluation.  **This note was dictated using voice recognition software/Dragon.  Despite best efforts to proofread, errors can occur which can change the meaning.  Any change was purely unintentional.

## 2020-04-13 ENCOUNTER — Inpatient Hospital Stay: Payer: Medicare Other

## 2020-04-13 DIAGNOSIS — D6869 Other thrombophilia: Secondary | ICD-10-CM

## 2020-04-13 DIAGNOSIS — J939 Pneumothorax, unspecified: Secondary | ICD-10-CM

## 2020-04-13 DIAGNOSIS — J9311 Primary spontaneous pneumothorax: Secondary | ICD-10-CM

## 2020-04-13 LAB — CBC
HCT: 38.5 % — ABNORMAL LOW (ref 39.0–52.0)
Hemoglobin: 12.8 g/dL — ABNORMAL LOW (ref 13.0–17.0)
MCH: 29.8 pg (ref 26.0–34.0)
MCHC: 33.2 g/dL (ref 30.0–36.0)
MCV: 89.5 fL (ref 80.0–100.0)
Platelets: 156 10*3/uL (ref 150–400)
RBC: 4.3 MIL/uL (ref 4.22–5.81)
RDW: 13.5 % (ref 11.5–15.5)
WBC: 16.5 10*3/uL — ABNORMAL HIGH (ref 4.0–10.5)
nRBC: 0 % (ref 0.0–0.2)

## 2020-04-13 LAB — BLOOD GAS, ARTERIAL
Acid-Base Excess: 3.2 mmol/L — ABNORMAL HIGH (ref 0.0–2.0)
Bicarbonate: 31.7 mmol/L — ABNORMAL HIGH (ref 20.0–28.0)
FIO2: 1
O2 Saturation: 94 %
PEEP: 10 cmH2O
Patient temperature: 37
Pressure control: 15 cmH2O
RATE: 20 resp/min
pCO2 arterial: 66 mmHg (ref 32.0–48.0)
pH, Arterial: 7.29 — ABNORMAL LOW (ref 7.350–7.450)
pO2, Arterial: 79 mmHg — ABNORMAL LOW (ref 83.0–108.0)

## 2020-04-13 LAB — COMPREHENSIVE METABOLIC PANEL
ALT: 45 U/L — ABNORMAL HIGH (ref 0–44)
AST: 31 U/L (ref 15–41)
Albumin: 2.1 g/dL — ABNORMAL LOW (ref 3.5–5.0)
Alkaline Phosphatase: 63 U/L (ref 38–126)
Anion gap: 11 (ref 5–15)
BUN: 40 mg/dL — ABNORMAL HIGH (ref 8–23)
CO2: 31 mmol/L (ref 22–32)
Calcium: 8.2 mg/dL — ABNORMAL LOW (ref 8.9–10.3)
Chloride: 94 mmol/L — ABNORMAL LOW (ref 98–111)
Creatinine, Ser: 0.79 mg/dL (ref 0.61–1.24)
GFR calc Af Amer: >60 mL/min (ref >60)
GFR calc non Af Amer: >60 mL/min (ref >60)
Glucose, Bld: 158 mg/dL — ABNORMAL HIGH (ref 70–99)
Potassium: 3.7 mmol/L (ref 3.5–5.1)
Sodium: 136 mmol/L (ref 135–145)
Total Bilirubin: 1.1 mg/dL (ref 0.3–1.2)
Total Protein: 5.3 g/dL — ABNORMAL LOW (ref 6.5–8.1)

## 2020-04-13 LAB — PROCALCITONIN: Procalcitonin: <0.1 ng/mL

## 2020-04-13 LAB — C-REACTIVE PROTEIN: CRP: 1.1 mg/dL — ABNORMAL HIGH (ref <1.0)

## 2020-04-13 LAB — GLUCOSE, CAPILLARY
Glucose-Capillary: 154 mg/dL — ABNORMAL HIGH (ref 70–99)
Glucose-Capillary: 155 mg/dL — ABNORMAL HIGH (ref 70–99)
Glucose-Capillary: 166 mg/dL — ABNORMAL HIGH (ref 70–99)
Glucose-Capillary: 168 mg/dL — ABNORMAL HIGH (ref 70–99)
Glucose-Capillary: 181 mg/dL — ABNORMAL HIGH (ref 70–99)
Glucose-Capillary: 189 mg/dL — ABNORMAL HIGH (ref 70–99)

## 2020-04-13 LAB — FERRITIN: Ferritin: 1754 ng/mL — ABNORMAL HIGH (ref 24–336)

## 2020-04-13 LAB — FIBRIN DERIVATIVES D-DIMER (ARMC ONLY): Fibrin derivatives D-dimer (ARMC): >7500 ng/mL (FEU) — ABNORMAL HIGH (ref 0.00–499.00)

## 2020-04-13 LAB — PHOSPHORUS: Phosphorus: 3 mg/dL (ref 2.5–4.6)

## 2020-04-13 MED ORDER — ETOMIDATE 2 MG/ML IV SOLN: 20.0000 mg | Freq: Once | INTRAVENOUS | Status: AC

## 2020-04-13 MED ORDER — FENTANYL CITRATE (PF) 100 MCG/2ML IJ SOLN
INTRAMUSCULAR | Status: AC
  Administered 2020-04-13: 200 ug
  Filled 2020-04-13: qty 4

## 2020-04-13 MED ORDER — VECURONIUM BROMIDE 10 MG IV SOLR
INTRAVENOUS | Status: AC
  Administered 2020-04-13: 10 mg
  Filled 2020-04-13: qty 10

## 2020-04-13 MED ORDER — FENTANYL CITRATE (PF) 100 MCG/2ML IJ SOLN: 25.0000 ug | Freq: Once | INTRAMUSCULAR | Status: DC

## 2020-04-13 MED ORDER — CHLORHEXIDINE GLUCONATE 0.12% ORAL RINSE (MEDLINE KIT)
15.0000 mL | Freq: Two times a day (BID) | OROMUCOSAL | Status: DC
  Administered 2020-04-14 (×2): 15 mL via OROMUCOSAL

## 2020-04-13 MED ORDER — DOCUSATE SODIUM 50 MG/5ML PO LIQD
100.0000 mg | Freq: Two times a day (BID) | ORAL | Status: DC
  Administered 2020-04-14: 100 mg via ORAL
  Filled 2020-04-13: qty 10

## 2020-04-13 MED ORDER — ORAL CARE MOUTH RINSE
15.0000 mL | OROMUCOSAL | Status: DC
  Administered 2020-04-13 – 2020-04-15 (×16): 15 mL via OROMUCOSAL

## 2020-04-13 MED ORDER — LIDOCAINE HCL (PF) 2 % IJ SOLN
10.0000 mL | Freq: Once | INTRAMUSCULAR | Status: AC
  Administered 2020-04-13: 10 mL via INTRADERMAL
  Filled 2020-04-13: qty 10

## 2020-04-13 MED ORDER — GUAIFENESIN 100 MG/5ML PO SOLN
15.0000 mL | Freq: Four times a day (QID) | ORAL | Status: DC
  Administered 2020-04-14 – 2020-04-15 (×5): 300 mg
  Filled 2020-04-13 (×11): qty 15

## 2020-04-13 MED ORDER — ETOMIDATE 2 MG/ML IV SOLN
INTRAVENOUS | Status: AC
  Administered 2020-04-13: 20 mg via INTRAVENOUS
  Filled 2020-04-13: qty 10

## 2020-04-13 MED ORDER — FENTANYL BOLUS VIA INFUSION
25.0000 ug | INTRAVENOUS | Status: DC | PRN
  Administered 2020-04-14: 25 ug via INTRAVENOUS
  Filled 2020-04-13: qty 25

## 2020-04-13 MED ORDER — MIDAZOLAM 50MG/50ML (1MG/ML) PREMIX INFUSION
0.5000 mg/h | INTRAVENOUS | Status: DC
  Administered 2020-04-13 – 2020-04-14 (×2): 0.5 mg/h via INTRAVENOUS
  Administered 2020-04-14 – 2020-04-15 (×3): 4 mg/h via INTRAVENOUS
  Filled 2020-04-13 (×5): qty 50

## 2020-04-13 MED ORDER — MIDAZOLAM HCL 2 MG/2ML IJ SOLN
1.0000 mg | INTRAMUSCULAR | Status: DC | PRN
  Administered 2020-04-14: 2 mg via INTRAVENOUS
  Filled 2020-04-13: qty 2

## 2020-04-13 MED ORDER — IPRATROPIUM-ALBUTEROL 0.5-2.5 (3) MG/3ML IN SOLN
3.0000 mL | Freq: Four times a day (QID) | RESPIRATORY_TRACT | Status: DC
  Administered 2020-04-14 – 2020-04-15 (×5): 3 mL via RESPIRATORY_TRACT
  Filled 2020-04-13 (×5): qty 3

## 2020-04-13 MED ORDER — MIDAZOLAM HCL 2 MG/2ML IJ SOLN: 1.0000 mg | INTRAMUSCULAR | Status: DC | PRN

## 2020-04-13 MED ORDER — FENTANYL 2500MCG IN NS 250ML (10MCG/ML) PREMIX INFUSION
25.0000 ug/h | INTRAVENOUS | Status: DC
  Administered 2020-04-13: 100 ug/h via INTRAVENOUS
  Administered 2020-04-14: 200 ug/h via INTRAVENOUS
  Administered 2020-04-14 – 2020-04-15 (×2): 225 ug/h via INTRAVENOUS
  Filled 2020-04-13 (×5): qty 250

## 2020-04-13 MED ORDER — MIDAZOLAM HCL 2 MG/2ML IJ SOLN
4.0000 mg | Freq: Once | INTRAMUSCULAR | Status: AC
  Administered 2020-04-13: 4 mg via INTRAVENOUS

## 2020-04-13 MED ORDER — MIDAZOLAM HCL 2 MG/2ML IJ SOLN: 4.0000 mg | Freq: Once | INTRAMUSCULAR | Status: AC

## 2020-04-13 MED ORDER — POLYETHYLENE GLYCOL 3350 17 G PO PACK: 17.0000 g | PACK | Freq: Every day | ORAL | Status: DC

## 2020-04-13 MED ORDER — NOREPINEPHRINE 4 MG/250ML-% IV SOLN
INTRAVENOUS | Status: AC
  Administered 2020-04-14: 2 ug/min via INTRAVENOUS
  Filled 2020-04-13: qty 250

## 2020-04-13 MED ORDER — PANTOPRAZOLE SODIUM 40 MG IV SOLR
40.0000 mg | Freq: Every day | INTRAVENOUS | Status: DC
  Administered 2020-04-14: 40 mg via INTRAVENOUS
  Filled 2020-04-13: qty 40

## 2020-04-13 MED ORDER — MIDAZOLAM HCL 2 MG/2ML IJ SOLN
INTRAMUSCULAR | Status: AC
  Filled 2020-04-13: qty 4

## 2020-04-13 MED ORDER — FENTANYL CITRATE (PF) 100 MCG/2ML IJ SOLN: 200.0000 ug | Freq: Once | INTRAMUSCULAR | Status: AC

## 2020-04-13 MED ORDER — INSULIN ASPART 100 UNIT/ML ~~LOC~~ SOLN
0.0000 [IU] | SUBCUTANEOUS | Status: DC
  Administered 2020-04-14 – 2020-04-15 (×9): 1 [IU] via SUBCUTANEOUS
  Filled 2020-04-13 (×7): qty 1

## 2020-04-13 NOTE — Progress Notes (Signed)
Radiologist called with results of CXR. Patient has left pneumothorax approximately 15-20% with a pneumomediastinum. Jeri Modena, NP notified of results.

## 2020-04-13 NOTE — Progress Notes (Addendum)
Follow up - Critical Care Medicine Note  Patient Details:    Johnny Shepard is an 69 y.o. male withPMH atrial fibrillation presented with shortness of breath, recent outpatient diagnosis with Covid. Admitted for acute hypoxic respiratory failure secondary to Covid multifocal pneumonia, admitted to stepdown, PCCM consulting.  04/12/20- patient continues to require elevated setting on HFNC.  Patient on Eliquis some concern for PE due to no improvement for 10 days despite awake alert lucid status, discussed with Dr Renae Gloss will perform CTPE protocol  Lines, Airways, Drains:    Anti-infectives:  Anti-infectives (From admission, onward)   Start     Dose/Rate Route Frequency Ordered Stop   04/08/20 0930  ivermectin (STROMECTOL) tablet 12,000 mcg        150 mcg/kg  84.7 kg Oral  Once 04/08/20 0844 04/08/20 1246   04/02/20 1000  remdesivir 100 mg in sodium chloride 0.9 % 100 mL IVPB       "Followed by" Linked Group Details   100 mg 200 mL/hr over 30 Minutes Intravenous Daily 03/16/2020 2025 04/05/20 1947   03/29/2020 2200  remdesivir 200 mg in sodium chloride 0.9% 250 mL IVPB       "Followed by" Linked Group Details   200 mg 580 mL/hr over 30 Minutes Intravenous Once 03/27/2020 2025 04/02/20 0015    Medications reviewed with Pharm.D.   Microbiology: Results for orders placed or performed during the hospital encounter of 04/09/2020  SARS Coronavirus 2 by RT PCR (hospital order, performed in [facility] hospital lab) Nasopharyngeal Nasopharyngeal Swab     Status: Abnormal   Collection Time: 03/22/2020  7:53 PM   Specimen: Nasopharyngeal Swab  Result Value Ref Range Status   SARS Coronavirus 2 POSITIVE (A) NEGATIVE Final    Comment: RESULT CALLED TO, READ BACK BY AND VERIFIED WITH: LEA FERGUISON 04/08/2020 AT 2115 BY AR (NOTE) SARS-CoV-2 target nucleic acids are DETECTED  SARS-CoV-2 RNA is generally detectable in upper respiratory specimens  during the acute phase of infection.  Positive results are  indicative  of the presence of the identified virus, but do not rule out bacterial infection or co-infection with other pathogens not detected by the test.  Clinical correlation with patient history and  other diagnostic information is necessary to determine patient infection status.  The expected result is negative.  Fact Sheet for Patients:   BoilerBrush.com.cy   Fact Sheet for Healthcare Providers:   https://pope.com/    This test is not yet approved or cleared by the [place] FDA and  has been authorized for detection and/or diagnosis of SARS-CoV-2 by FDA under an Emergency Use Authorization (EUA).  This EUA will remain in effect (meaning this t est can be used) for the duration of  the COVID-19 declaration under Section 564(b)(1) of the Act, 21 U.S.C. section 360-bbb-3(b)(1), unless the authorization is terminated or revoked sooner.  Performed at [facility], [address] [place], [place] [address]   Culture, blood (Routine X 2) w Reflex to ID Panel     Status: None   Collection Time: 04/10/2020  9:20 PM   Specimen: BLOOD  Result Value Ref Range Status   Specimen Description BLOOD BLOOD RIGHT FOREARM  Final   Special Requests   Final    BOTTLES DRAWN AEROBIC AND ANAEROBIC Blood Culture adequate volume   Culture   Final    NO GROWTH 5 DAYS Performed at [facility], [address], [place], [place] [address]    Report Status 04/06/2020  FINAL  Final  Culture, blood (Routine X 2) w Reflex to ID Panel     Status: None   Collection Time: 03/28/2020  9:31 PM   Specimen: BLOOD  Result Value Ref Range Status   Specimen Description BLOOD RIGHT ANTECUBITAL  Final   Special Requests   Final    BOTTLES DRAWN AEROBIC AND ANAEROBIC Blood Culture results may not be optimal due to an excessive volume of blood received in culture bottles   Culture   Final    NO GROWTH 5 DAYS Performed at [facility],  [address] [place], [place] [address]    Report Status 04/06/2020 FINAL  Final  MRSA PCR Screening     Status: None   Collection Time: 04/02/20 10:00 AM   Specimen: Nasal Mucosa; Nasopharyngeal  Result Value Ref Range Status   MRSA by PCR NEGATIVE NEGATIVE Final    Comment:        The GeneXpert MRSA Assay (FDA approved for NASAL specimens only), is one component of a comprehensive MRSA colonization surveillance program. It is not intended to diagnose MRSA infection nor to guide or monitor treatment for MRSA infections. Performed at [facility], [address] [place], [place] [address]    Laboratory data reviewed.   Best Practice/Protocols:  VTE Prophylaxis: Direct Thrombin Inhibitor GI Prophylaxis: Antihistamine   Events: 04/07/20- no overnight events patient verbalizes in full sentences, relates clinical improvement. 04/08/20- patient in good spirits , he is still on elevated settings with HFNC.Encourage use of IS. 8/30- Requiring HFNC + NRB,no conversational dyspnea. 8/31-pneumomediastinum noted on chest x-ray, acetylcysteine infusion 5-day protocol started. 9/1- patient continues to require elevated settings on high flow and desaturates with urination and any minimal activity.   Studies: US Venous Img Lower Bilateral (DVT)  Result Date: 04/05/2020 CLINICAL DATA:  Bilateral lower extremity pain and edema. Elevated D-dimer. Patient is currently on anticoagulation. Evaluate for DVT. EXAM: BILATERAL LOWER EXTREMITY VENOUS DOPPLER ULTRASOUND TECHNIQUE: Gray-scale sonography with graded compression, as well as color Doppler and duplex ultrasound were performed to evaluate the lower extremity deep venous systems from the level of the common femoral vein and including the common femoral, femoral, profunda femoral, popliteal and calf veins including the posterior tibial, peroneal and gastrocnemius veins when visible. The superficial great saphenous vein was also  interrogated. Spectral Doppler was utilized to evaluate flow at rest and with distal augmentation maneuvers in the common femoral, femoral and popliteal veins. COMPARISON:  None. FINDINGS: RIGHT LOWER EXTREMITY Common Femoral Vein: No evidence of thrombus. Normal compressibility, respiratory phasicity and response to augmentation. Saphenofemoral Junction: No evidence of thrombus. Normal compressibility and flow on color Doppler imaging. Profunda Femoral Vein: No evidence of thrombus. Normal compressibility and flow on color Doppler imaging. Femoral Vein: No evidence of thrombus. Normal compressibility, respiratory phasicity and response to augmentation. Popliteal Vein: No evidence of thrombus. Normal compressibility, respiratory phasicity and response to augmentation. Calf Veins: No evidence of thrombus. Normal compressibility and flow on color Doppler imaging. Superficial Great Saphenous Vein: No evidence of thrombus. Normal compressibility. Venous Reflux:  None. Other Findings:  None. LEFT LOWER EXTREMITY Common Femoral Vein: No evidence of thrombus. Normal compressibility, respiratory phasicity and response to augmentation. Saphenofemoral Junction: No evidence of thrombus. Normal compressibility and flow on color Doppler imaging. Profunda Femoral Vein: No evidence of thrombus. Normal compressibility and flow on color Doppler imaging. Femoral Vein: No evidence of thrombus. Normal compressibility, respiratory phasicity and response to augmentation. Popliteal Vein: No evidence of thrombus.  Normal compressibility, respiratory phasicity and response to augmentation. Calf Veins: No evidence of thrombus. Normal compressibility and flow on color Doppler imaging. Superficial Great Saphenous Vein: No evidence of thrombus. Normal compressibility. Venous Reflux:  None. Other Findings:  None. IMPRESSION: No evidence of DVT within either lower extremity. Electronically Signed   By: Simonne Come M.D.   On: 04/05/2020 07:34    DG Chest Port 1 View  Result Date: 04/13/2020 CLINICAL DATA:  Pneumothorax. EXAM: PORTABLE CHEST 1 VIEW COMPARISON:  Same day. FINDINGS: Stable cardiomediastinal silhouette. Pneumomediastinum is again noted with right supraclavicular soft tissue emphysema. Small left apical pneumothorax is noted which is slightly enlarged compared to prior exam. Stable bilateral lung opacities are noted concerning for atelectasis or infection. Bony thorax is unremarkable. IMPRESSION: Small left apical pneumothorax is noted which is slightly enlarged compared to prior exam. Stable pneumomediastinum and right supraclavicular soft tissue emphysema. Stable bilateral lung opacities are noted concerning for atelectasis or infection. Electronically Signed   By: Lupita Raider M.D.   On: 04/13/2020 12:26   DG Chest Port 1 View  Result Date: 04/13/2020 CLINICAL DATA:  Hypoxia. EXAM: PORTABLE CHEST 1 VIEW COMPARISON:  04/10/2020 chest radiograph FINDINGS: A new small to moderate LEFT pneumothorax is noted, approximately 20%. Pneumomediastinum extending into the neck is also identified. Diffuse bilateral airspace opacities are again noted. Cardiomediastinal silhouette is stable. No pleural effusion or acute bony abnormality identified. IMPRESSION: 1. New small to moderate LEFT pneumothorax, approximately 20%. Pneumomediastinum extending into the neck. 2. Diffuse bilateral airspace opacities again noted. Critical Value/emergent results were called by telephone at the time of interpretation on 04/13/2020 at 8:31 am to provider Sarah, charge nurse, who verbally acknowledged these results. Electronically Signed   By: Harmon Pier M.D.   On: 04/13/2020 08:31   DG Chest Port 1 View  Result Date: 04/10/2020 CLINICAL DATA:  Shortness of breath. EXAM: PORTABLE CHEST 1 VIEW COMPARISON:  2020-04-20. FINDINGS: Stable cardiomediastinal silhouette. No pneumothorax or pleural effusion is noted. Stable bibasilar lung opacities are noted  concerning for pneumonia or possibly infiltrates. However, there may be pneumomediastinum present at this time. CT scan is recommended for further evaluation. Bony thorax is unremarkable. IMPRESSION: Stable bibasilar lung opacities are noted concerning for pneumonia or possibly infiltrates. However, there may be pneumomediastinum present at this time. CT scan of the chest is recommended for further evaluation. These results will be called to the ordering clinician or representative by the Radiologist Assistant, and communication documented in the PACS or zVision Dashboard. Electronically Signed   By: Lupita Raider M.D.   On: 04/10/2020 08:29   DG Chest Portable 1 View  Result Date: 20-Apr-2020 CLINICAL DATA:  Dyspnea EXAM: PORTABLE CHEST 1 VIEW COMPARISON:  None. FINDINGS: The lungs are well expanded and are symmetric. Minimal patchy pulmonary infiltrate may be present at the left lung base and within the right mid lung zone, possibly infectious or inflammatory in the acute setting. There is no pneumothorax or pleural effusion. Cardiac size within normal limits. No acute bone abnormality. IMPRESSION: Minimal patchy pulmonary infiltrate may be present at the left lung base and within the right mid lung zone, possibly infectious or inflammatory in the acute setting. Electronically Signed   By: Helyn Numbers MD   On: 04/20/2020 19:39    Consults: Treatment Team:  Vida Rigger, MD Pccm, [facility], MD   Subjective:    Overnight Issues: Remains with high FiO2 requirement.  No conversational dyspnea.  In good spirits.  Doing self proning as tolerated.  Using incentive spirometer and Acapella.  Small pneumomediastinum noted on chest x-ray, he has no subcu emphysema.  Objective:  Vital signs for last 24 hours: Temp:  [97.4 F (36.3 C)-98.2 F (36.8 C)] 97.4 F (36.3 C) (09/03 1700) Pulse Rate:  [30-109] 88 (09/03 1800) Resp:  [12-26] 24 (09/03 1800) BP: (103-133)/(58-107) 126/58 (09/03  1800) SpO2:  [82 %-98 %] 91 % (09/03 1800) FiO2 (%):  [88 %-90 %] 90 % (09/03 1317)  Hemodynamic parameters for last 24 hours:    Intake/Output from previous day: 09/02 0701 - 09/03 0700 In: 1327.2 [P.O.:200; I.V.:1127.2] Out: 2200 [Urine:2200]  Intake/Output this shift: No intake/output data recorded.  Vent settings for last 24 hours: FiO2 (%):  [88 %-90 %] 90 %  Physical Exam:   GENERAL: Awake, alert, no conversational dyspnea.  On high flow nasal cannula, intermittent use of nonrebreather as well. HEAD: Normocephalic, atraumatic.  EYES: Pupils equal, round, reactive to light.  No scleral icterus.  MOUTH: Oral mucosa moist, no thrush. NECK: Supple.  No crepitus. Trachea midline. No JVD.  No adenopathy. PULMONARY: Good air entry bilaterally.  Coarse.  CARDIOVASCULAR: Monitor shows atrial fibrillation in good control. ABDOMEN: Benign. MUSCULOSKELETAL: No joint deformity, no clubbing, no edema.  NEUROLOGIC: No focal deficits, speech is fluent. SKIN: Intact,warm,dry.  No rashes. PSYCH: Mood and behavior appropriate.  Assessment/Plan:  Acute respiratory failure with hypoxia due to COVID-19 COVID19 pneumonia -Remdesevir antiviral - pharmacy protocol 5 d -Baricitinib as Actemra on shortage -vitamin C -zinc -Solu-Medrol 40 mg every 12 -Diuresis -as needed and tolerated -Self proning as tolerated -Patient using IS and Acapella device for bronchopulmonary hygiene  -d/c hepatotoxic medications while on remdesevir -supportive care with SDU telemetry monitoring -PT/OT when possible -procalcitonin, CRP and ferritin trending  Pneumomediastinum and LEFT PTX Has been observed with delta variant of COVID-19 Apical left PTX  Follow chest x-ray as q6h for now  Atrial fibrillation  -on eliquis and cardizem -rate controlled   ID  -Currently no evidence of bacterial infection -follow up cultures -Monitor WBC, trend procalcitonin  GI/Nutrition Good p.o.  intake  ENDO -ICU hypoglycemic\Hyperglycemia protocol -check FSBS per protocol   ELECTROLYTES -follow labs as needed -replace as needed -pharmacy consultation    LOS: 12 days   Additional comments:Discussed during multidisciplinary rounds.  Care coordination with bedside nurse was done. Discussed with radiology  Critical Care Total Time*: 33

## 2020-04-13 NOTE — Evaluation (Signed)
Physical Therapy Evaluation Patient Details Name: Johnny Shepard MRN: [id number] DOB: 1951-05-05 Today's Date: 04/13/2020   History of Present Illness  Johnny Shepard is an 69 y.o. male with PMH atrial fibrillation presented with shortness of breath, recent outpatient diagnosis with Covid.  Admitted for acute hypoxic respiratory failure secondary to Covid multifocal pneumonia. CXR revealed pneumothorax.  Clinical Impression  Pt highly motivated to do all he can but did consistently have drops in O2 sats with even modest activity.  He did have O2 sats occasionally in the 90s, but generally fluctuated high to low 80s during session with obvious desaturation with minimal activity.  He showed great effort and patience with prolonged breaks between sets of 5 or so light LE exercises.  Pt showed good strength and coordination with all tasks, but again breathing was the main focus t/o session.  He was able to get himself to EOB w/o assist, maintained sitting 3 minutes with up/down but generally trending down O2 sats - when he was no longer able to maintain sats >80 we did get back to supine - similarly to entirety of session we rested with focused breathing and he would gradually increase numbers (verified with second pulseox t/o session) but rarely even into the 90s.  Pt highly motivated and great overall effort.    Follow Up Recommendations  (per progress/CV status, hopes to go home with HHPT)    Equipment Recommendations  Rolling walker with 5" wheels (per progress, likely will at least need O2)    Recommendations for Other Services       Precautions / Restrictions Precautions Precautions: Fall Restrictions Weight Bearing Restrictions: No      Mobility  Bed Mobility Overal bed mobility: Modified Independent             General bed mobility comments: Pt was able to get himself to sitting EOB w/o assist, cues for breathing, posture, sequencing.  Sat at EOB for 3 full minutes with gradually  dropping O2 sats but pt did excellent job in focusing on breathing and was able to maintain >70% sats t/o majority of the effort.  Transfers                 General transfer comment: deferred (2/2 quicklness of desaturation with any activity)  Ambulation/Gait                Stairs            Wheelchair Mobility    Modified Rankin (Stroke Patients Only)       Balance Overall balance assessment: Modified Independent (pt able to maintain sitting balance well, no standing tested)                                           Pertinent Vitals/Pain Pain Assessment: 0-10 Pain Score: 5  Faces Pain Scale: Hurts little more Pain Location: chest tighntness Pain Descriptors / Indicators: Tightness Pain Intervention(s): Limited activity within patient's tolerance;Monitored during session    Home Living Family/patient expects to be discharged to:: Private residence Living Arrangements: Spouse/significant other Available Help at Discharge: Family;Available 24 hours/day Type of Home: House Home Access: Stairs to enter;Ramped entrance   Entrance Stairs-Number of Steps: 7, but can use ramp that was previously built for granddaughter. Home Layout: One level Home Equipment: Shower seat Additional Comments: shower seat is for granddaughter    Prior Function  Level of Independence: Independent         Comments: drives, works on his farm, manages medicine himself, performs all aspects of self care and mobility I'ly     Hand Dominance   Dominant Hand: Left    Extremity/Trunk Assessment   Upper Extremity Assessment Upper Extremity Assessment: Overall WFL for tasks assessed    Lower Extremity Assessment Lower Extremity Assessment: Overall WFL for tasks assessed (quick to fatigue, but good gross strength)       Communication   Communication: No difficulties (desats with modest amount of talking)  Cognition Arousal/Alertness:  Awake/alert Behavior During Therapy: WFL for tasks assessed/performed Overall Cognitive Status: Within Functional Limits for tasks assessed                                        General Comments General comments (skin integrity, edema, etc.): Pt with highly labile O2 saturations t/o session.  Generally able to maintain O2 in high 80s at "rest," even with very focused breathing sats consistently dropped with even light activity    Exercises General Exercises - Lower Extremity Ankle Circles/Pumps: AROM;10 reps Heel Slides: AROM;10 reps (2 X 5 with rest breaks, between - light leg ext resistance) Hip ABduction/ADduction: AROM;Strengthening;10 reps (2 X 5 reps with rest breaks, cues for breathing) Other Exercises Other Exercises: OT facilitates education re: role of OT in acute setting, safety considerations, PLB, relaxation techniques, benefits of OOB activity. Pt with good understanding. Would benefit from f/u for EC strategies.   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 PT Goal Formulation: With patient Time For Goal Achievement: 05/04/20 Potential to Achieve Goals: Fair    Frequency Min 2X/week   Barriers to discharge        Co-evaluation               AM-PAC PT "6 Clicks" Mobility  Outcome Measure Help needed turning from your back to your side while in a flat bed without using bedrails?: None Help needed moving from lying on your back to sitting on the side of a flat bed without using bedrails?: None Help needed moving to and from a  bed to a chair (including a wheelchair)?: A Lot Help needed standing up from a chair using your arms (e.g., wheelchair or bedside chair)?: A Little Help needed to walk in hospital room?: Total Help needed climbing 3-5 steps with a railing? : Total 6 Click Score: 15    End of Session Equipment Utilized During Treatment: Oxygen (50 L HFNC with 15L non-rebreather t/o session) Activity Tolerance: Patient limited by fatigue Patient left: in bed;with call bell/phone within reach Nurse Communication: Mobility status PT Visit Diagnosis: Muscle weakness (generalized) (M62.81);Unsteadiness on feet (R26.81);Difficulty in walking, not elsewhere classified (R26.2)    Time: 4270-6237 PT Time Calculation (min) (ACUTE ONLY): 48 min   Charges:   PT Evaluation $PT Eval Moderate Complexity: 1 Mod PT Treatments $Therapeutic Exercise: 8-22 mins $Therapeutic Activity: 8-22 mins  Malachi Pro, DPT 04/13/2020, 6:10 PM

## 2020-04-13 NOTE — Progress Notes (Signed)
Patient ID: Johnny Shepard, male   DOB: 1950/09/16, 69 y.o.   MRN: [id number] [facility] PROGRESS NOTE  Johnny Shepard FGH:[id number] DOB: 17-Apr-1951 DOA: 2020/04/05 PCP: Patient, No Pcp Per  HPI/Subjective: Patient did complain of some chest tightness.  Has some shortness of breath and some cough.  Desaturates with limited movement.  Admitted with acute respiratory failure and COVID-19 pneumonia.  Objective: Vitals:   04/13/20 1127 04/13/20 1205  BP:    Pulse:    Resp:    Temp: (!) 97.4 F (36.3 C) (!) 97.5 F (36.4 C)  SpO2:      Intake/Output Summary (Last 24 hours) at 04/13/2020 1325 Last data filed at 04/13/2020 1206 Gross per 24 hour  Intake 1327.2 ml  Output 2225 ml  Net -897.8 ml   Filed Weights   04-05-20 1852 04/02/20 0915  Weight: 79 kg 84.7 kg    ROS: Review of Systems  Respiratory: Positive for cough and shortness of breath.   Cardiovascular: Positive for chest pain.  Gastrointestinal: Negative for abdominal pain, nausea and vomiting.   Exam: Physical Exam HENT:     Nose: No mucosal edema.     Mouth/Throat:     Pharynx: No oropharyngeal exudate.  Eyes:     General: Lids are normal.     Extraocular Movements: Extraocular movements intact.     Conjunctiva/sclera: Conjunctivae normal.  Cardiovascular:     Rate and Rhythm: Normal rate. Rhythm regularly irregular.     Heart sounds: Normal heart sounds, S1 normal and S2 normal.  Pulmonary:     Breath sounds: Examination of the right-lower field reveals decreased breath sounds and rhonchi. Examination of the left-lower field reveals decreased breath sounds and rhonchi. Decreased breath sounds and rhonchi present. No wheezing or rales.  Abdominal:     Palpations: Abdomen is soft.     Tenderness: There is no abdominal tenderness.  Musculoskeletal:     Right ankle: No swelling.     Left ankle: No swelling.  Skin:    General: Skin is warm.     Findings: No rash.  Neurological:     Mental Status: He is  alert and oriented to person, place, and time.       Data Reviewed: Basic Metabolic Panel: Recent Labs  Lab 04/09/20 0409 04/10/20 0523 04/11/20 0415 04/12/20 0432 04/13/20 0932  NA 135 134* 133* 133* 136  K 5.0 4.9 4.7 4.3 3.7  CL 96* 96* 94* 94* 94*  CO2 31 31 30 30 31   GLUCOSE 155* 151* 205* 193* 158*  BUN 32* 36* 41* 46* 40*  CREATININE 0.75 0.82 0.75 0.90 0.79  CALCIUM 8.1* 7.9* 8.4* 8.2* 8.2*  MG  --   --  2.5*  --   --   PHOS  --   --  3.0 3.2 3.0   Liver Function Tests: Recent Labs  Lab 04/09/20 0409 04/10/20 0523 04/11/20 0415 04/12/20 0432 04/13/20 0932  AST 30 27 24 24 31   ALT 44 44 43 41 45*  ALKPHOS 52 50 55 52 63  BILITOT 1.1 1.2 0.9 1.1 1.1  PROT 5.3* 5.2* 5.5* 5.2* 5.3*  ALBUMIN 2.1* 2.1* 2.2* 2.0* 2.1*   CBC: Recent Labs  Lab 04/08/20 0517 04/09/20 0409 04/10/20 0523 04/11/20 0415 04/12/20 0432  WBC 12.5* 13.3* 15.3* 17.8* 14.2*  NEUTROABS 11.7* 12.5* 14.2* 16.5* 13.0*  HGB 12.8* 12.5* 12.3* 13.3 12.3*  HCT 35.8* 36.5* 35.7* 36.9* 35.8*  MCV 85.9 87.7 89.0 84.4 87.1  PLT  251 233 208 204 162   BNP (last 3 results) Recent Labs    2020-04-30 2120  BNP 92.0     CBG: Recent Labs  Lab 04/12/20 2008 04/12/20 2318 04/13/20 0330 04/13/20 0746 04/13/20 1200  GLUCAP 297* 210* 181* 166* 155*     Studies: DG Chest Port 1 View  Result Date: 04/13/2020 CLINICAL DATA:  Pneumothorax. EXAM: PORTABLE CHEST 1 VIEW COMPARISON:  Same day. FINDINGS: Stable cardiomediastinal silhouette. Pneumomediastinum is again noted with right supraclavicular soft tissue emphysema. Small left apical pneumothorax is noted which is slightly enlarged compared to prior exam. Stable bilateral lung opacities are noted concerning for atelectasis or infection. Bony thorax is unremarkable. IMPRESSION: Small left apical pneumothorax is noted which is slightly enlarged compared to prior exam. Stable pneumomediastinum and right supraclavicular soft tissue emphysema. Stable  bilateral lung opacities are noted concerning for atelectasis or infection. Electronically Signed   By: Lupita Raider M.D.   On: 04/13/2020 12:26   DG Chest Port 1 View  Result Date: 04/13/2020 CLINICAL DATA:  Hypoxia. EXAM: PORTABLE CHEST 1 VIEW COMPARISON:  04/10/2020 chest radiograph FINDINGS: A new small to moderate LEFT pneumothorax is noted, approximately 20%. Pneumomediastinum extending into the neck is also identified. Diffuse bilateral airspace opacities are again noted. Cardiomediastinal silhouette is stable. No pleural effusion or acute bony abnormality identified. IMPRESSION: 1. New small to moderate LEFT pneumothorax, approximately 20%. Pneumomediastinum extending into the neck. 2. Diffuse bilateral airspace opacities again noted. Critical Value/emergent results were called by telephone at the time of interpretation on 04/13/2020 at 8:31 am to provider Sarah, charge nurse, who verbally acknowledged these results. Electronically Signed   By: Harmon Pier M.D.   On: 04/13/2020 08:31    Scheduled Meds:  apixaban  5 mg Oral BID   vitamin C  500 mg Oral Daily   aspirin EC  325 mg Oral Daily   baricitinib  4 mg Oral Daily   chlorhexidine  15 mL Mouth Rinse BID   Chlorhexidine Gluconate Cloth  6 each Topical Daily   cholecalciferol  1,000 Units Oral Daily   diltiazem  180 mg Oral Q12H   famotidine  20 mg Oral BID   feeding supplement (ENSURE ENLIVE)  237 mL Oral TID BM   guaiFENesin  600 mg Oral BID   insulin aspart  0-5 Units Subcutaneous QHS   insulin aspart  0-6 Units Subcutaneous TID WC   mouth rinse  15 mL Mouth Rinse q12n4p   methylPREDNISolone (SOLU-MEDROL) injection  40 mg Intravenous Q12H   nystatin  5 mL Oral QID   zinc sulfate  220 mg Oral Daily   Continuous Infusions:  acetylcysteine 15 mg/kg/hr (04/13/20 0600)    Assessment/Plan:  1. Acute hypoxic respiratory failure secondary to COVID-19 pneumonia.  Patient still on high flow nasal cannula 45 L and  100% nonrebreather.  He still desaturates when I took off the 100% nonrebreather this morning.  Continue trying to do prone positioning.  Continue Solu-Medrol.  Continue baricitinib which was started 8/23.Marland Kitchen  Finished remdesivir.  Patient on acetylcysteine as per critical care specialist. 2. 20% left pneumothorax with chest pain and shortness of breath.  Patient also has pneumomediastinum.  Case discussed with critical care team and they would wish to watch things at this point in time. 3. Chronic atrial fibrillation on diltiazem for rate control and Eliquis for stroke prevention 4. 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 5. Weakness.  PT and OT consultations    Code Status:  Code Status Orders  (From admission, onward)         Start     Ordered   03/14/2020 2022  Full code  Continuous        03/11/2020 2025        Code Status History    This patient has a current code status but no historical code status.   Advance Care Planning Activity     Family Communication: Spoke with wife on the phone Disposition Plan: Status is: Inpatient  Dispo: The patient is from: Home              Anticipated d/c is to: Home              Anticipated d/c date is: Yet to be determined secondary to being on so much oxygen              Patient currently being treated for acute hypoxic respiratory failure and COVID-19 pneumonia.  Time spent: 28 minutes  Dina [employer]

## 2020-04-13 NOTE — Evaluation (Signed)
Occupational Therapy Evaluation Patient Details Name: Johnny Shepard MRN: [id number] DOB: 1951-07-13 Today's Date: 04/13/2020    History of Present Illness Johnny Shepard is an 69 y.o. male with PMH atrial fibrillation presented with shortness of breath, recent outpatient diagnosis with Covid.  Admitted for acute hypoxic respiratory failure secondary to Covid multifocal pneumonia. CXR revealed pneumothorax.   Clinical Impression   Pt was seen for OT evaluation this date. 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 Pt lives with spouse in [facility] with basement with 7 STE and ramped entrance (was built for granddaughter with CP). Currently pt primarily functionally limited 2/2 oxygenation status/decreased fxl activity tolerance which is impacting his ability to care for himself. Pt currently requires setup for all aspects of bed level self care as he is unable to gather ADL items himself (physically, pt's musculature would indicate he could appropriately/safely ambulate, but he cannot currently tolerate, deferred on this evaluation as pt is on NRB and HFNC and still de-satting into the 80s).  Pt would benefit from skilled OT to address energy conservation for I/ADLs, decrease risk for muscle atrophy, and gradually increase fxl activity tolerance to highest attainable level/PLOF. Upon hospital discharge, recommend HHOT to maximize pt safety and return to functional independence during meaningful occupations of daily life.     Follow Up Recommendations  Home health OT    Equipment Recommendations  Tub/shower seat;3 in 1 bedside commode    Recommendations for Other Services       Precautions / Restrictions Precautions Precautions: Fall Restrictions Weight Bearing Restrictions: No      Mobility Bed Mobility               General bed mobility  comments: deferred, pt on NRB and HFNC  Transfers                 General transfer comment: deferred    Balance                                           ADL either performed or assessed with clinical judgement   ADL Overall ADL's : Needs assistance/impaired                                       General ADL Comments: at least setup d/t low tolerance, physically, pt able to perform all aspects of self care once items gathered for him.     Vision Patient Visual Report: No change from baseline       Perception     Praxis      Pertinent Vitals/Pain Pain Assessment: Faces Faces Pain Scale: Hurts little more Pain Location: chest tighntness Pain Descriptors / Indicators: Tightness Pain Intervention(s): Limited activity within patient's tolerance;Monitored during session     Hand Dominance Left   Extremity/Trunk Assessment Upper Extremity Assessment Upper Extremity Assessment: Overall WFL for tasks assessed   Lower Extremity Assessment Lower Extremity Assessment: Overall WFL for tasks assessed       Communication Communication Communication: No difficulties   Cognition Arousal/Alertness: Awake/alert Behavior During Therapy: WFL for tasks assessed/performed Overall Cognitive Status: Within Functional Limits for tasks assessed  General Comments       Exercises Other Exercises Other Exercises: OT facilitates education re: role of OT in acute setting, safety considerations, PLB, relaxation techniques, benefits of OOB activity. Pt with good understanding. Would benefit from f/u for EC strategies.   Shoulder Instructions      Home Living Family/patient expects to be discharged to:: Private residence Living Arrangements: Spouse/significant other Available Help at Discharge: Family;Available 24 hours/day Type of Home: House Home Access: Stairs to enter;Ramped  entrance Entrance Stairs-Number of Steps: 7, but can use ramp that was previously built for granddaughter.   Home Layout: One level (with basement that he doesn't have to use with level entry at the back of house)     Bathroom Shower/Tub: Tub/shower unit;Walk-in shower (2 restrooms)   Firefighter: Electrical engineer: None          Prior Functioning/Environment Level of Independence: Independent        Comments: drives, works on his farm, manages medicine himself, performs all aspects of self care and mobility I'ly        OT Problem List: Decreased strength;Decreased activity tolerance;Decreased knowledge of use of DME or AE;Cardiopulmonary status limiting activity      OT Treatment/Interventions: Self-care/ADL training;DME and/or AE instruction;Therapeutic activities;Balance training;Energy conservation;Patient/family education    OT Goals(Current goals can be found in the care plan section) Acute Rehab OT Goals Patient Stated Goal: To be able to get home and get back to working on the farm OT Goal Formulation: With patient Time For Goal Achievement: 04/27/20 Potential to Achieve Goals: Good ADL Goals Pt Will Perform Grooming: with set-up;sitting (unsupported sitting to complete 2-3 g/h tasks to increase fxl activity tolerance.) Pt Will Perform Lower Body Dressing: with supervision;sitting/lateral leans (with AE PRN for EC) Pt Will Transfer to Toilet: with supervision;bedside commode Pt Will Perform Toileting - Clothing Manipulation and hygiene: with supervision;sit to/from stand (or lateral lean technique for EC) Pt/caregiver will Perform Home Exercise Program: Increased strength;Both right and left upper extremity;With Supervision Additional ADL Goal #1: Pt will verbalize/demo 3 ways to implement EC strategies into ADL routine with 0% verbal cues.  OT Frequency: Min 2X/week   Barriers to D/C:            Co-evaluation              AM-PAC OT "6  Clicks" Daily Activity     Outcome Measure Help from another person eating meals?: None Help from another person taking care of personal grooming?: None Help from another person toileting, which includes using toliet, bedpan, or urinal?: A Little Help from another person bathing (including washing, rinsing, drying)?: A Little Help from another person to put on and taking off regular upper body clothing?: None Help from another person to put on and taking off regular lower body clothing?: A Little 6 Click Score: 21   End of Session Nurse Communication: Mobility status  Activity Tolerance: Patient tolerated treatment well;Treatment limited secondary to medical complications (Comment) (physically tolerated well, limited by oxygenation status) Patient left: in bed;with call bell/phone within reach  OT Visit Diagnosis: Muscle weakness (generalized) (M62.81)                Time: 7408-1448 OT Time Calculation (min): 31 min Charges:  OT General Charges $OT Visit: 1 Visit OT Evaluation $OT Eval Moderate Complexity: 1 Mod OT Treatments $Self Care/Home Management : 8-22 mins  Rejeana Brock, MS, OTR/L ascom [phone] 04/13/20, 5:09 PM

## 2020-04-14 ENCOUNTER — Inpatient Hospital Stay: Payer: Medicare Other

## 2020-04-14 DIAGNOSIS — J9600 Acute respiratory failure, unspecified whether with hypoxia or hypercapnia: Secondary | ICD-10-CM

## 2020-04-14 LAB — CBC
HCT: 39.4 % (ref 39.0–52.0)
HCT: 38.9 % — ABNORMAL LOW (ref 39.0–52.0)
Hemoglobin: 13 g/dL (ref 13.0–17.0)
Hemoglobin: 12.2 g/dL — ABNORMAL LOW (ref 13.0–17.0)
MCH: 29.9 pg (ref 26.0–34.0)
MCH: 29.9 pg (ref 26.0–34.0)
MCHC: 33 g/dL (ref 30.0–36.0)
MCHC: 31.4 g/dL (ref 30.0–36.0)
MCV: 90.6 fL (ref 80.0–100.0)
MCV: 95.3 fL (ref 80.0–100.0)
Platelets: 168 10*3/uL (ref 150–400)
Platelets: 145 10*3/uL — ABNORMAL LOW (ref 150–400)
RBC: 4.35 MIL/uL (ref 4.22–5.81)
RBC: 4.08 MIL/uL — ABNORMAL LOW (ref 4.22–5.81)
RDW: 13.8 % (ref 11.5–15.5)
RDW: 14.3 % (ref 11.5–15.5)
WBC: 24.4 10*3/uL — ABNORMAL HIGH (ref 4.0–10.5)
WBC: 23.1 10*3/uL — ABNORMAL HIGH (ref 4.0–10.5)
nRBC: 0 % (ref 0.0–0.2)
nRBC: 0 % (ref 0.0–0.2)

## 2020-04-14 LAB — GLUCOSE, CAPILLARY
Glucose-Capillary: 143 mg/dL — ABNORMAL HIGH (ref 70–99)
Glucose-Capillary: 190 mg/dL — ABNORMAL HIGH (ref 70–99)
Glucose-Capillary: 183 mg/dL — ABNORMAL HIGH (ref 70–99)
Glucose-Capillary: 191 mg/dL — ABNORMAL HIGH (ref 70–99)
Glucose-Capillary: 194 mg/dL — ABNORMAL HIGH (ref 70–99)
Glucose-Capillary: 160 mg/dL — ABNORMAL HIGH (ref 70–99)

## 2020-04-14 LAB — COMPREHENSIVE METABOLIC PANEL
ALT: 52 U/L — ABNORMAL HIGH (ref 0–44)
ALT: 51 U/L — ABNORMAL HIGH (ref 0–44)
AST: 38 U/L (ref 15–41)
AST: 37 U/L (ref 15–41)
Albumin: 2 g/dL — ABNORMAL LOW (ref 3.5–5.0)
Albumin: 2.1 g/dL — ABNORMAL LOW (ref 3.5–5.0)
Alkaline Phosphatase: 57 U/L (ref 38–126)
Alkaline Phosphatase: 67 U/L (ref 38–126)
Anion gap: 13 (ref 5–15)
Anion gap: 11 (ref 5–15)
BUN: 48 mg/dL — ABNORMAL HIGH (ref 8–23)
BUN: 58 mg/dL — ABNORMAL HIGH (ref 8–23)
CO2: 27 mmol/L (ref 22–32)
CO2: 28 mmol/L (ref 22–32)
Calcium: 7.8 mg/dL — ABNORMAL LOW (ref 8.9–10.3)
Calcium: 7.8 mg/dL — ABNORMAL LOW (ref 8.9–10.3)
Chloride: 97 mmol/L — ABNORMAL LOW (ref 98–111)
Chloride: 98 mmol/L (ref 98–111)
Creatinine, Ser: 0.84 mg/dL (ref 0.61–1.24)
Creatinine, Ser: 1.48 mg/dL — ABNORMAL HIGH (ref 0.61–1.24)
GFR calc Af Amer: >60 mL/min (ref >60)
GFR calc Af Amer: 55 mL/min — ABNORMAL LOW (ref >60)
GFR calc non Af Amer: >60 mL/min (ref >60)
GFR calc non Af Amer: 48 mL/min — ABNORMAL LOW (ref >60)
Glucose, Bld: 155 mg/dL — ABNORMAL HIGH (ref 70–99)
Glucose, Bld: 182 mg/dL — ABNORMAL HIGH (ref 70–99)
Potassium: 3.7 mmol/L (ref 3.5–5.1)
Potassium: 4.2 mmol/L (ref 3.5–5.1)
Sodium: 137 mmol/L (ref 135–145)
Sodium: 137 mmol/L (ref 135–145)
Total Bilirubin: 0.9 mg/dL (ref 0.3–1.2)
Total Bilirubin: 1 mg/dL (ref 0.3–1.2)
Total Protein: 5.2 g/dL — ABNORMAL LOW (ref 6.5–8.1)
Total Protein: 5.5 g/dL — ABNORMAL LOW (ref 6.5–8.1)

## 2020-04-14 LAB — PROTIME-INR
INR: 1.4 — ABNORMAL HIGH (ref 0.8–1.2)
Prothrombin Time: 16.7 seconds — ABNORMAL HIGH (ref 11.4–15.2)

## 2020-04-14 LAB — PHOSPHORUS
Phosphorus: 5 mg/dL — ABNORMAL HIGH (ref 2.5–4.6)
Phosphorus: 5.8 mg/dL — ABNORMAL HIGH (ref 2.5–4.6)

## 2020-04-14 LAB — PROCALCITONIN
Procalcitonin: <0.1 ng/mL
Procalcitonin: <0.1 ng/mL

## 2020-04-14 LAB — TROPONIN I (HIGH SENSITIVITY)
Troponin I (High Sensitivity): 49 ng/L — ABNORMAL HIGH (ref <18)
Troponin I (High Sensitivity): 74 ng/L — ABNORMAL HIGH (ref <18)

## 2020-04-14 LAB — C-REACTIVE PROTEIN: CRP: 4.4 mg/dL — ABNORMAL HIGH (ref <1.0)

## 2020-04-14 LAB — FIBRIN DERIVATIVES D-DIMER (ARMC ONLY): Fibrin derivatives D-dimer (ARMC): >7500 ng/mL (FEU) — ABNORMAL HIGH (ref 0.00–499.00)

## 2020-04-14 LAB — FERRITIN: Ferritin: 2451 ng/mL — ABNORMAL HIGH (ref 24–336)

## 2020-04-14 LAB — MAGNESIUM: Magnesium: 2.2 mg/dL (ref 1.7–2.4)

## 2020-04-14 MED ORDER — APIXABAN 5 MG PO TABS: 5.0000 mg | ORAL_TABLET | Freq: Two times a day (BID) | ORAL | Status: DC

## 2020-04-14 MED ORDER — ALBUMIN HUMAN 5 % IV SOLN
25.0000 g | Freq: Once | INTRAVENOUS | Status: AC
  Administered 2020-04-14: 25 g via INTRAVENOUS
  Filled 2020-04-14: qty 500

## 2020-04-14 MED ORDER — PHENYLEPHRINE CONCENTRATED 100MG/250ML (0.4 MG/ML) INFUSION SIMPLE
0.0000 ug/min | INTRAVENOUS | Status: DC
  Administered 2020-04-14: 200 ug/min via INTRAVENOUS
  Administered 2020-04-14: 400 ug/min via INTRAVENOUS
  Administered 2020-04-14: 370 ug/min via INTRAVENOUS
  Administered 2020-04-15: 380 ug/min via INTRAVENOUS
  Administered 2020-04-15: 390 ug/min via INTRAVENOUS
  Administered 2020-04-15: 400 ug/min via INTRAVENOUS
  Filled 2020-04-14 (×7): qty 250

## 2020-04-14 MED ORDER — FAMOTIDINE 20 MG PO TABS
20.0000 mg | ORAL_TABLET | Freq: Two times a day (BID) | ORAL | Status: DC
  Administered 2020-04-14 (×2): 20 mg
  Filled 2020-04-14 (×3): qty 1

## 2020-04-14 MED ORDER — POLYETHYLENE GLYCOL 3350 17 G PO PACK
17.0000 g | PACK | Freq: Every day | ORAL | Status: DC
  Administered 2020-04-14 – 2020-04-15 (×2): 17 g
  Filled 2020-04-14 (×2): qty 1

## 2020-04-14 MED ORDER — SODIUM CHLORIDE 0.9 % IV SOLN
2.0000 g | Freq: Two times a day (BID) | INTRAVENOUS | Status: DC
  Administered 2020-04-14: 2 g via INTRAVENOUS
  Filled 2020-04-14 (×3): qty 2

## 2020-04-14 MED ORDER — BARICITINIB 2 MG PO TABS
4.0000 mg | ORAL_TABLET | Freq: Every day | ORAL | Status: DC
  Administered 2020-04-14: 4 mg
  Filled 2020-04-14: qty 2

## 2020-04-14 MED ORDER — LACTATED RINGERS IV BOLUS
500.0000 mL | Freq: Once | INTRAVENOUS | Status: AC
  Administered 2020-04-14: 500 mL via INTRAVENOUS

## 2020-04-14 MED ORDER — ZINC SULFATE 220 (50 ZN) MG PO CAPS
220.0000 mg | ORAL_CAPSULE | Freq: Every day | ORAL | Status: DC
  Administered 2020-04-14 – 2020-04-15 (×2): 220 mg
  Filled 2020-04-14 (×2): qty 1

## 2020-04-14 MED ORDER — DIGOXIN 0.25 MG/ML IJ SOLN
0.1250 mg | Freq: Once | INTRAMUSCULAR | Status: AC
  Administered 2020-04-14: 0.125 mg via INTRAVENOUS
  Filled 2020-04-14 (×2): qty 2

## 2020-04-14 MED ORDER — DILTIAZEM LOAD VIA INFUSION
10.0000 mg | Freq: Once | INTRAVENOUS | Status: AC
  Administered 2020-04-14: 10 mg via INTRAVENOUS
  Filled 2020-04-14: qty 10

## 2020-04-14 MED ORDER — DILTIAZEM HCL-DEXTROSE 125-5 MG/125ML-% IV SOLN (PREMIX)
5.0000 mg/h | INTRAVENOUS | Status: DC
  Administered 2020-04-14: 5 mg/h via INTRAVENOUS
  Filled 2020-04-14: qty 125

## 2020-04-14 MED ORDER — DILTIAZEM HCL 25 MG/5ML IV SOLN
10.0000 mg | Freq: Once | INTRAVENOUS | Status: AC
  Administered 2020-04-14: 10 mg via INTRAVENOUS

## 2020-04-14 MED ORDER — ENSURE ENLIVE PO LIQD: 237.0000 mL | Freq: Three times a day (TID) | ORAL | Status: DC

## 2020-04-14 MED ORDER — DIGOXIN 0.25 MG/ML IJ SOLN
0.5000 mg | Freq: Once | INTRAMUSCULAR | Status: AC
  Administered 2020-04-14: 0.5 mg via INTRAVENOUS
  Filled 2020-04-14: qty 2

## 2020-04-14 MED ORDER — BARICITINIB 2 MG PO TABS
2.0000 mg | ORAL_TABLET | Freq: Every day | ORAL | Status: DC
  Filled 2020-04-14: qty 1

## 2020-04-14 MED ORDER — APIXABAN 5 MG PO TABS
5.0000 mg | ORAL_TABLET | Freq: Two times a day (BID) | ORAL | Status: DC
  Administered 2020-04-14 – 2020-04-15 (×2): 5 mg
  Filled 2020-04-14 (×2): qty 1

## 2020-04-14 MED ORDER — VASOPRESSIN 20 UNITS/100 ML INFUSION FOR SHOCK
0.0000 [IU]/min | INTRAVENOUS | Status: DC
  Administered 2020-04-14 – 2020-04-15 (×3): 0.03 [IU]/min via INTRAVENOUS
  Filled 2020-04-14 (×3): qty 100

## 2020-04-14 MED ORDER — ASCORBIC ACID 500 MG PO TABS
500.0000 mg | ORAL_TABLET | Freq: Every day | ORAL | Status: DC
  Administered 2020-04-14 – 2020-04-15 (×2): 500 mg
  Filled 2020-04-14 (×2): qty 1

## 2020-04-14 MED ORDER — NOREPINEPHRINE 4 MG/250ML-% IV SOLN: 0.0000 ug/min | INTRAVENOUS | Status: DC

## 2020-04-14 MED ORDER — VITAMIN D 25 MCG (1000 UNIT) PO TABS
1000.0000 [IU] | ORAL_TABLET | Freq: Every day | ORAL | Status: DC
  Administered 2020-04-14 – 2020-04-15 (×2): 1000 [IU]
  Filled 2020-04-14 (×2): qty 1

## 2020-04-14 MED ORDER — DOCUSATE SODIUM 50 MG/5ML PO LIQD
100.0000 mg | Freq: Two times a day (BID) | ORAL | Status: DC
  Administered 2020-04-14 – 2020-04-15 (×3): 100 mg
  Filled 2020-04-14 (×3): qty 10

## 2020-04-14 NOTE — Procedures (Signed)
Central Venous Catheter Insertion Procedure Note  VERLIE HELLENBRAND  [id number]  07-Nov-1950  Date:04/14/20  Time:5:12 AM   Provider Performing:Magddalene S Tukov-Yual   Procedure: Insertion of Non-tunneled Central Venous [phone]) with US guidance ([id number])   Indication(s) Medication administration and Difficult access  Consent Risks of the procedure as well as the alternatives and risks of each were explained to the patient and/or caregiver.  Consent for the procedure was obtained and is signed in the bedside chart  Anesthesia Topical only with 1% lidocaine   Timeout Verified patient identification, verified procedure, site/side was marked, verified correct patient position, special equipment/implants available, medications/allergies/relevant history reviewed, required imaging and test results available.  Sterile Technique Maximal sterile technique including full sterile barrier drape, hand hygiene, sterile gown, sterile gloves, mask, hair covering, sterile ultrasound probe cover (if used).  Procedure Description Area of catheter insertion was cleaned with chlorhexidine and draped in sterile fashion.  With real-time ultrasound guidance a central venous catheter was placed into the left femoral vein. Nonpulsatile blood flow and easy flushing noted in all ports.  The catheter was sutured in place and sterile dressing applied.  Complications/Tolerance None; patient tolerated the procedure well. Chest X-ray is ordered to verify placement for internal jugular or subclavian cannulation.   Chest x-ray is not ordered for femoral cannulation.  EBL Minimal  Specimen(s) None  Procedure performed under direct supervision of Dr.Brescia. Ultrasound utilized for realtime vessel cannulation  Myrakle Wingler [facility] ANP-BC Pulmonary and Critical Care Medicine [facility] Pager [phone] or [phone]  NB: This document was prepared using Dragon voice recognition software and may  include unintentional dictation errors.

## 2020-04-14 NOTE — Progress Notes (Addendum)
PULMONARY / CRITICAL CARE MEDICINE   Name: OCTAVIA MOTTOLA MRN: [id number] DOB: September 25, 1950    ADMISSION DATE:  2020-04-27  CHIEF COMPLAINT:  Covid infection  HISTORY OF PRESENT ILLNESS:   THAI BURGUENO is an 69 y.o. male withPMH atrial fibrillation presented with shortness of breath, recent outpatient diagnosis with Covid. Admitted for acute hypoxic respiratory failure secondary to Covid multifocal pneumonia, admitted to stepdown, PCCM consulting. 04/12/20- patient continues to require elevated setting on HFNC.  Patient on Eliquis some concern for PE due to no improvement for 10 days despite awake alert lucid status.Lower extremity negative for DVT   Events: 04/07/20- no overnight events patient verbalizes in full sentences, relates clinical improvement. 04/08/20- patient in good spirits , he is still on elevated settings with HFNC.Encourage use of IS. 8/30- Requiring HFNC + NRB,no conversational dyspnea. 8/31-pneumomediastinum noted on chest x-ray, acetylcysteine infusion 5-day protocol started. 9/1- patient continues to require elevated settings on high flow and desaturates with urination and any minimal activity.  9/3: Emergently intubated and lined for acute hypoxia, left pneumothorax s/p left pigtail placement  SUBJECTIVE:  Last night at about 1930, patient became acutely short of breath and hypoxic. He was proned and given a bronchodilator to no avail.  The decision was made to intubate the patient after a lengthy discussion with the wife he consented for the procedure.  Patient was emergently intubated and tolerated the procedure well.  VITAL SIGNS: BP (!) 87/53 (BP Location: Left Arm)   Pulse (!) 111   Temp 98.2 F (36.8 C) (Esophageal)   Resp 19   Ht 6' (1.829 m)   Wt 84.7 kg   SpO2 90%   BMI 25.33 kg/m   HEMODYNAMICS:    VENTILATOR SETTINGS: Vent Mode: PCV FiO2 (%):  [88 %-100 %] 100 % Set Rate:  [20 bmp-24 bmp] 24 bmp PEEP:  [10 cmH20] 10 cmH20 Plateau Pressure:   [23 cmH20-24 cmH20] 23 cmH20  INTAKE / OUTPUT: I/O last 3 completed shifts: In: 1706.4 [P.O.:200; I.V.:1506.4] Out: 2595 [Urine:2595]  PHYSICAL EXAMINATION: General: No acute distress, sedated HEENT: Normocephalic and atraumatic, PERRLA, trachea midline, no JVD, ET tube in place Neuro: Gag reflex intact, corneal reflexes intact, withdraws to pain Cardiovascular: Apical pulse mildly tachycardic, regular, S1-S2, no murmur regurg or gallop, +2 pulses bilaterally, no edema Lungs: Bilateral breath sounds with diffuse rhonchi in all lung fields Abdomen: Nondistended, normal bowel sounds in all 4 quadrants palpation reveals no organomegaly Musculoskeletal: Positive range of motion no joint deformities Skin: Warm and dry  LABS:  BMET Recent Labs  Lab 04/12/20 0432 04/13/20 0932 04/13/20 2306  NA 133* 136 137  K 4.3 3.7 3.7  CL 94* 94* 97*  CO2 30 31 27   BUN 46* 40* 48*  CREATININE 0.90 0.79 0.84  GLUCOSE 193* 158* 155*    Electrolytes Recent Labs  Lab 04/11/20 0415 04/11/20 0415 04/12/20 0432 04/13/20 0932 04/13/20 2306  CALCIUM 8.4*   < > 8.2* 8.2* 7.8*  MG 2.5*  --   --   --  2.2  PHOS 3.0   < > 3.2 3.0 5.0*   < > = values in this interval not displayed.    CBC Recent Labs  Lab 04/11/20 0415 04/12/20 0432 04/13/20 2306  WBC 17.8* 14.2* 16.5*  HGB 13.3 12.3* 12.8*  HCT 36.9* 35.8* 38.5*  PLT 204 162 156    Coag's No results for input(s): APTT, INR in the last 168 hours.  Sepsis Markers Recent Labs  Lab  04/12/20 0432 04/13/20 0932 04/13/20 2306  PROCALCITON <0.10 <0.10 <0.10    ABG Recent Labs  Lab 04/13/20 2200  PHART 7.29*  PCO2ART 66*  PO2ART 79*    Liver Enzymes Recent Labs  Lab 04/12/20 0432 04/13/20 0932 04/13/20 2306  AST 24 31 38  ALT 41 45* 52*  ALKPHOS 52 63 57  BILITOT 1.1 1.1 0.9  ALBUMIN 2.0* 2.1* 2.0*    Cardiac Enzymes No results for input(s): TROPONINI, PROBNP in the last 168 hours.  Glucose Recent Labs  Lab  04/13/20 0746 04/13/20 1200 04/13/20 1746 04/13/20 2136 04/13/20 2350 04/14/20 0335  GLUCAP 166* 155* 189* 154* 168* 143*    Imaging DG Chest Port 1 View  Result Date: 04/13/2020 CLINICAL DATA:  Endotracheal and OG tube placement. EXAM: PORTABLE CHEST 1 VIEW COMPARISON:  Radiograph earlier today. FINDINGS: The endotracheal tube tip is 2.4 cm from the carina. Enteric tube in place with tip and side-port below the diaphragm in the stomach. Placement of left pigtail catheter with decreased left pneumothorax. Small residual at the apex under the second rib. Again seen pneumomediastinum with subcutaneous emphysema in the right chest wall and bilateral supraclavicular soft tissues. No visualized right pneumothorax. Patchy heterogeneous bilateral lung opacities without significant interval change. No large pleural effusion. IMPRESSION: 1. Endotracheal tube tip 2.4 cm from the carina. Enteric tube in place with tip and side-port below the diaphragm in the stomach. 2. Placement of left pigtail catheter with decreased left pneumothorax. Small residual at the left apex under the second rib. 3. Unchanged pneumomediastinum and subcutaneous emphysema. Unchanged bilateral lung opacities typical of COVID-19 pneumonia. Electronically Signed   By: Narda Rutherford M.D.   On: 04/13/2020 21:34   DG Chest Port 1 View  Result Date: 04/13/2020 CLINICAL DATA:  Pneumothorax. EXAM: PORTABLE CHEST 1 VIEW COMPARISON:  Same day. FINDINGS: Stable cardiomediastinal silhouette. Pneumomediastinum is again noted with right supraclavicular soft tissue emphysema. Small left apical pneumothorax is noted which is slightly enlarged compared to prior exam. Stable bilateral lung opacities are noted concerning for atelectasis or infection. Bony thorax is unremarkable. IMPRESSION: Small left apical pneumothorax is noted which is slightly enlarged compared to prior exam. Stable pneumomediastinum and right supraclavicular soft tissue emphysema.  Stable bilateral lung opacities are noted concerning for atelectasis or infection. Electronically Signed   By: Lupita Raider M.D.   On: 04/13/2020 12:26   DG Chest Port 1 View  Result Date: 04/13/2020 CLINICAL DATA:  Hypoxia. EXAM: PORTABLE CHEST 1 VIEW COMPARISON:  04/10/2020 chest radiograph FINDINGS: A new small to moderate LEFT pneumothorax is noted, approximately 20%. Pneumomediastinum extending into the neck is also identified. Diffuse bilateral airspace opacities are again noted. Cardiomediastinal silhouette is stable. No pleural effusion or acute bony abnormality identified. IMPRESSION: 1. New small to moderate LEFT pneumothorax, approximately 20%. Pneumomediastinum extending into the neck. 2. Diffuse bilateral airspace opacities again noted. Critical Value/emergent results were called by telephone at the time of interpretation on 04/13/2020 at 8:31 am to provider Sarah, charge nurse, who verbally acknowledged these results. Electronically Signed   By: Harmon Pier M.D.   On: 04/13/2020 08:31   STUDIES:  Echo pending  CULTURES: None  ANTIBIOTICS: None  LINES/TUBES: OG tube Central venous catheter left femoral ET tube Peripheral IVs Foley catheter  DISCUSSION: 69 year old male presenting with Covid pneumonia now in full-blown respiratory failure requiring emergent intubation  ASSESSMENT / PLAN:  PULMONARY A: Acute hypoxic respiratory failure secondary to COVID-19 COVID-19 pneumonia Pneumomediastinum and LEFT PTX s/p  chest tube placement-mostly observe with delta variant of COVID-19 P: -Full vent support with current settings -VAP protocol -ABG and chest x-ray as needed -Remdesevir antiviral - pharmacy protocol 5 d -vitamin C -zinc -Solu-Medrol 40 mg every 12 -Trend procalcitonin, CRP and ferritin  -Chest tube per protocol to -20 cm H2O  CARDIOVASCULAR A:  Chronic atrial fibrillation P:  Was on Eliquis and diltiazem prior to admission Cardizem resumed however  neosynephrine was maxed Digoxin started to hopefully gain rate control without impacting BP Vasopressin added Albumin 5% 500cc bolus  RENAL A:   AKI P:   Gentle hydration Monitor renal indices, creatinine rising Monitor and correct electrolytes   HEMATOLOGIC A:   Rule out DVT P:  Lower extremity Dopplers negative  INFECTIOUS A:   COVID-19 infection P:   -Remains hypotensive -pan-culture -aggressive rise in WBC, trend procalcitonin -Empiric antibiosis started with Cefepime    ENDOCRINE A:   Mild hyperglycemia without a diagnosis of type 2 diabetes P:   -ICU hypoglycemic\Hyperglycemia protocol -check FSBS per protocol  NEUROLOGIC A:    Acute hypoxic encephalopathy P:   RASS goal: 0 to -1 Monitor mental status closely Fentanyl and as needed Versed for vent discomfort   FAMILY  - Updated after chest tube   Patient seen and examined face to face fashion Images of the day and lab values reviewed

## 2020-04-14 NOTE — Progress Notes (Signed)
Patient ID: Johnny Shepard, male   DOB: [birth year], 69 y.o.   MRN: [id number]  Events of last night noted.  Since patient intubated, care transferred over to critical care team.  Case Discussed with Dr Earlie Server.  Will sign off  Dr Alford Highland

## 2020-04-14 NOTE — Progress Notes (Signed)
Patient maintained on mechanical ventilation this shift. Lots of issues with heart rate and then with BP and heart rate. Patient transition from afib in lower 100s to afib RVR anywhere from 120s to 160s (often sustaining in the 150s). Attempted to control with transitioning off levophed and to neosynephrine as blood pressure support medication, then with cardizem push and then infusion. Cardizem then caused significant decrease in BP so it was stopped, vasopressin IV added, and medication instead changed to digoxin IV. Albumin dose also given. Towards the end of the shift, the patient had better heart rate control while meeting blood pressure goals. Sedation titrated to keep patient calm and to maintain synchrony with ventilator.

## 2020-04-14 NOTE — Procedures (Addendum)
Intubation Procedure Note  Johnny Shepard  [id number]  29-Jul-1951  Date:04/14/20  Time:5:13 AM   Provider Performing: Dr Elyn Aquas, assisted by Charise Carwin   Procedure: Intubation (31500)  Indication(s) Respiratory Failure  Consent Risks of the procedure as well as the alternatives and risks of each were explained to the patient and/or caregiver.  Consent for the procedure was obtained and is signed in the bedside chart   Anesthesia Etomidate, Versed and Fentanyl   Time Out Verified patient identification, verified procedure, site/side was marked, verified correct patient position, special equipment/implants available, medications/allergies/relevant history reviewed, required imaging and test results available.   Sterile Technique Usual hand hygeine, masks, and gloves were used   Procedure Description Patient positioned in bed supine.  Sedation given as noted above.  Patient was intubated with endotracheal tube using Glidescope.  View was Grade 1 full glottis .  Number of attempts was 1.  Colorimetric CO2 detector was consistent with tracheal placement.  Complications/Tolerance None; patient tolerated the procedure well. Chest X-ray is ordered to verify placement.   EBL None   Specimen(s) None Johnny Shepard S. [facility] ANP-BC Pulmonary and Critical Care Medicine [facility] Pager [phone] or [phone]  NB: This document was prepared using Dragon voice recognition software and may include unintentional dictation errors.

## 2020-04-15 ENCOUNTER — Inpatient Hospital Stay: Payer: Medicare Other

## 2020-04-15 ENCOUNTER — Inpatient Hospital Stay: Admit: 2020-04-15 | Payer: Medicare Other

## 2020-04-15 LAB — COMPREHENSIVE METABOLIC PANEL
ALT: 923 U/L — ABNORMAL HIGH (ref 0–44)
AST: 644 U/L — ABNORMAL HIGH (ref 15–41)
Albumin: 2.3 g/dL — ABNORMAL LOW (ref 3.5–5.0)
Alkaline Phosphatase: 81 U/L (ref 38–126)
Anion gap: 17 — ABNORMAL HIGH (ref 5–15)
BUN: 74 mg/dL — ABNORMAL HIGH (ref 8–23)
CO2: 24 mmol/L (ref 22–32)
Calcium: 7.6 mg/dL — ABNORMAL LOW (ref 8.9–10.3)
Chloride: 100 mmol/L (ref 98–111)
Creatinine, Ser: 3.31 mg/dL — ABNORMAL HIGH (ref 0.61–1.24)
GFR calc Af Amer: 21 mL/min — ABNORMAL LOW (ref >60)
GFR calc non Af Amer: 18 mL/min — ABNORMAL LOW (ref >60)
Glucose, Bld: 196 mg/dL — ABNORMAL HIGH (ref 70–99)
Potassium: 5.1 mmol/L (ref 3.5–5.1)
Sodium: 141 mmol/L (ref 135–145)
Total Bilirubin: 1.6 mg/dL — ABNORMAL HIGH (ref 0.3–1.2)
Total Protein: 5.5 g/dL — ABNORMAL LOW (ref 6.5–8.1)

## 2020-04-15 LAB — BASIC METABOLIC PANEL
Anion gap: 24 — ABNORMAL HIGH (ref 5–15)
BUN: 83 mg/dL — ABNORMAL HIGH (ref 8–23)
CO2: 18 mmol/L — ABNORMAL LOW (ref 22–32)
Calcium: 7.1 mg/dL — ABNORMAL LOW (ref 8.9–10.3)
Chloride: 101 mmol/L (ref 98–111)
Creatinine, Ser: 4.4 mg/dL — ABNORMAL HIGH (ref 0.61–1.24)
GFR calc Af Amer: 15 mL/min — ABNORMAL LOW (ref >60)
GFR calc non Af Amer: 13 mL/min — ABNORMAL LOW (ref >60)
Glucose, Bld: 86 mg/dL (ref 70–99)
Potassium: 7.1 mmol/L (ref 3.5–5.1)
Sodium: 143 mmol/L (ref 135–145)

## 2020-04-15 LAB — GLUCOSE, CAPILLARY
Glucose-Capillary: 167 mg/dL — ABNORMAL HIGH (ref 70–99)
Glucose-Capillary: 178 mg/dL — ABNORMAL HIGH (ref 70–99)
Glucose-Capillary: 171 mg/dL — ABNORMAL HIGH (ref 70–99)
Glucose-Capillary: 137 mg/dL — ABNORMAL HIGH (ref 70–99)

## 2020-04-15 LAB — CBC
HCT: 36.8 % — ABNORMAL LOW (ref 39.0–52.0)
Hemoglobin: 11.7 g/dL — ABNORMAL LOW (ref 13.0–17.0)
MCH: 30.4 pg (ref 26.0–34.0)
MCHC: 31.8 g/dL (ref 30.0–36.0)
MCV: 95.6 fL (ref 80.0–100.0)
Platelets: 124 10*3/uL — ABNORMAL LOW (ref 150–400)
RBC: 3.85 MIL/uL — ABNORMAL LOW (ref 4.22–5.81)
RDW: 14.6 % (ref 11.5–15.5)
WBC: 21.2 10*3/uL — ABNORMAL HIGH (ref 4.0–10.5)
nRBC: 0 % (ref 0.0–0.2)

## 2020-04-15 LAB — FIBRIN DERIVATIVES D-DIMER (ARMC ONLY): Fibrin derivatives D-dimer (ARMC): >7500 ng/mL (FEU) — ABNORMAL HIGH (ref 0.00–499.00)

## 2020-04-15 LAB — MAGNESIUM: Magnesium: 3.2 mg/dL — ABNORMAL HIGH (ref 1.7–2.4)

## 2020-04-15 LAB — FERRITIN: Ferritin: >7500 ng/mL — ABNORMAL HIGH (ref 24–336)

## 2020-04-15 LAB — DIGOXIN LEVEL: Digoxin Level: 1.1 ng/mL (ref 0.8–2.0)

## 2020-04-15 LAB — PHOSPHORUS: Phosphorus: 10.4 mg/dL — ABNORMAL HIGH (ref 2.5–4.6)

## 2020-04-15 LAB — PROCALCITONIN: Procalcitonin: 5.53 ng/mL

## 2020-04-15 MED ORDER — AMIODARONE HCL IN DEXTROSE 360-4.14 MG/200ML-% IV SOLN
INTRAVENOUS | Status: AC
  Filled 2020-04-15: qty 200

## 2020-04-15 MED ORDER — AMIODARONE LOAD VIA INFUSION
150.0000 mg | Freq: Once | INTRAVENOUS | Status: AC
Start: 2020-04-15 — End: 2020-04-15
  Administered 2020-04-15: 150 mg via INTRAVENOUS

## 2020-04-15 MED ORDER — SODIUM CHLORIDE 0.9 % IV SOLN
2.0000 g | INTRAVENOUS | Status: DC
  Filled 2020-04-15: qty 2

## 2020-04-15 MED ORDER — ATROPINE SULFATE 1 MG/10ML IJ SOSY
PREFILLED_SYRINGE | INTRAMUSCULAR | Status: AC
  Administered 2020-04-15: 1 mg via INTRAVENOUS
  Filled 2020-04-15: qty 10

## 2020-04-15 MED ORDER — BARICITINIB 2 MG PO TABS
2.0000 mg | ORAL_TABLET | Freq: Every day | ORAL | Status: AC
  Administered 2020-04-15: 2 mg

## 2020-04-15 MED ORDER — NOREPINEPHRINE 4 MG/250ML-% IV SOLN
0.0000 ug/min | INTRAVENOUS | Status: DC
  Administered 2020-04-15: 4 ug/min via INTRAVENOUS

## 2020-04-15 MED ORDER — LACTATED RINGERS IV BOLUS
1000.0000 mL | Freq: Once | INTRAVENOUS | Status: AC
  Administered 2020-04-15: 1000 mL via INTRAVENOUS

## 2020-04-15 MED ORDER — AMIODARONE HCL IN DEXTROSE 360-4.14 MG/200ML-% IV SOLN: 60.0000 mg/h | INTRAVENOUS | Status: DC

## 2020-04-15 MED ORDER — VECURONIUM BROMIDE 10 MG IV SOLR: 10.0000 mg | INTRAVENOUS | Status: DC | PRN

## 2020-04-15 MED ORDER — ATROPINE SULFATE 1 MG/10ML IJ SOSY: 1.0000 mg | PREFILLED_SYRINGE | Freq: Once | INTRAMUSCULAR | Status: AC

## 2020-04-15 MED ORDER — AMIODARONE HCL IN DEXTROSE 360-4.14 MG/200ML-% IV SOLN: 30.0000 mg/h | INTRAVENOUS | Status: DC

## 2020-04-15 MED ORDER — FAMOTIDINE 20 MG PO TABS
20.0000 mg | ORAL_TABLET | Freq: Every day | ORAL | Status: DC
  Administered 2020-04-15: 20 mg

## 2020-04-15 MED ORDER — ALBUMIN HUMAN 5 % IV SOLN
25.0000 g | INTRAVENOUS | Status: AC
  Administered 2020-04-15: 25 g via INTRAVENOUS
  Filled 2020-04-15: qty 500

## 2020-04-15 MED ORDER — AMIODARONE IV BOLUS ONLY 150 MG/100ML
INTRAVENOUS | Status: AC
  Administered 2020-04-15: 150 mg
  Filled 2020-04-15: qty 100

## 2020-04-16 MED FILL — Norepinephrine Bitartrate IV Soln 1 MG/ML (Base Equivalent): INTRAVENOUS | Qty: 4 | Status: AC

## 2020-04-16 MED FILL — Sodium Chloride IV Soln 0.9%: INTRAVENOUS | Qty: 250 | Status: AC

## 2020-04-19 LAB — CULTURE, BLOOD (ROUTINE X 2)
Culture: NO GROWTH
Culture: NO GROWTH
Special Requests: ADEQUATE

## 2020-05-11 NOTE — Progress Notes (Signed)
PHARMACY NOTE:  ANTIMICROBIAL RENAL DOSAGE ADJUSTMENT  Current antimicrobial regimen includes a mismatch between antimicrobial dosage and estimated renal function.  As per policy approved by the Pharmacy & Therapeutics and Medical Executive Committees, the antimicrobial dosage will be adjusted accordingly.  Current antimicrobial dosage:  cefepime 2 grams IV every 12 hours  Indication: empiric  Renal Function:  Estimated Creatinine Clearance: 23.1 mL/min (A) (by C-G formula based on SCr of 3.31 mg/dL (H)). []      On intermittent HD, scheduled: []      On CRRT     Antimicrobial dosage has been changed to:  cefepime 2 grams IV every 24 hours   Thank you for allowing pharmacy to be a part of this patient's care.  , [facility] 05/02/2020 7:36 AM

## 2020-05-11 NOTE — Progress Notes (Signed)
OT Cancellation Note  Patient Details Name: Johnny Shepard MRN: [id number] DOB: February 12, 1951   Cancelled Treatment:    Reason Eval/Treat Not Completed: Medical issues which prohibited therapy  Upon chart review, noted that pt was intubated on the evening of 9/3 and remains intubated at this time. Will complete OT order and sign off. Please re-consult if/when pt becomes appropriate. Thank you.  Rejeana Brock, MS, OTR/L ascom [phone] 05/02/2020, 2:45 PM

## 2020-05-11 NOTE — Progress Notes (Signed)
GOALS OF CARE DISCUSSION  The Clinical status was relayed to family in detail.  Updated and notified of patients medical condition.  Patient remains unresponsive and will not open eyes to command.    patient with increased WOB and using accessory muscles to breathe Explained to family course of therapy and the modalities     Patient with Progressive multiorgan failure with very low chance of meaningful recovery despite all aggressive and optimal medical therapy.  Family understands the situation.  They have consented and agreed to DNR.  Family are satisfied with Plan of action and management. All questions answered   Lucie Leather, M.D.  [facility]  Medical Director [facility] Medical Director [facility] Cardio-Pulmonary Department

## 2020-05-11 NOTE — Progress Notes (Signed)
Patient flipped back to supine. After he was supine he began to flip in and out of toresades de points or at least ventricular tachycardia. Labs obtained and amiodarone started per dr. Belia Heman. Wife at bedside since before turn due to concerns with patient's blood pressure requiring three pressors. During amiodarone bolus patient became bradycardiac in the 30s. Amiodarone stopped. 1mg  of atropine given per Dr. with no response. Blood pressure still readable but at this time no radial or femoral pulse felt or dopplerable. Family at bedside. Hiral RN appraised of situation.

## 2020-05-11 NOTE — Progress Notes (Signed)
PT Cancellation Note  Patient Details Name: Johnny Shepard MRN: [id number] DOB: 1950/11/06   Cancelled Treatment:    Reason Eval/Treat Not Completed: Other (comment)   Chart reviewed.  Discharged to to intubation and general decline.  Will discharge and await new orders when appropriate.   Danielle Dess 04/12/2020, 8:25 AM

## 2020-05-11 NOTE — Progress Notes (Signed)
CRITICAL CARE NOTE Johnny Shepard an 69 y.o.malewithPMH atrial fibrillation presented with shortness of breath, recent outpatient diagnosis with Covid. Admitted for acute hypoxic respiratory failure secondary to Covid multifocal pneumonia, admitted to stepdown, PCCM consulting. 04/12/20- patient continues to require elevated setting on HFNC. Patient on Eliquis some concern for PE due to no improvement for 10 days despite awake alert lucid status.Lower extremity negative for DVT   Events: 04/07/20- no overnight events patient verbalizes in full sentences, relates clinical improvement. 04/08/20- patient in good spirits , he is still on elevated settings with HFNC.Encourage use of IS. 8/30- Requiring HFNC + NRB,no conversational dyspnea. 8/31-pneumomediastinum noted on chest x-ray, acetylcysteine infusion 5-day protocol started. 9/1- patient continues to require elevated settings on high flow and desaturates with urination and any minimal activity.  9/3: Emergently intubated and lined for acute hypoxia, left pneumothorax s/p left pigtail placement 9/5 severe ARDS PRONED POSITION   CC  follow up respiratory failure  SUBJECTIVE Patient remains critically ill Prognosis is guarded   BP (!) 136/104   Pulse (!) 124   Temp (!) 95.9 F (35.5 C) (Esophageal)   Resp (!) 24   Ht 6' 0.01" (1.829 m)   Wt 84.7 kg   SpO2 95%   BMI 25.32 kg/m    I/O last 3 completed shifts: In: 4104 [I.V.:2734.2; IV Piggyback:1369.7] Out: 878 [Urine:835; Chest Tube:43] No intake/output data recorded.  SpO2: 95 % O2 Flow Rate (L/min): 50 L/min FiO2 (%): 100 %  Estimated body mass index is 25.32 kg/m as calculated from the following:   Height as of this encounter: 6' 0.01" (1.829 m).   Weight as of this encounter: 84.7 kg.  SIGNIFICANT EVENTS   REVIEW OF SYSTEMS  PATIENT IS UNABLE TO PROVIDE COMPLETE REVIEW OF SYSTEMS DUE TO SEVERE CRITICAL ILLNESS      COVID-19 DISASTER DECLARATION:    FULL CONTACT PHYSICAL EXAMINATION WAS NOT POSSIBLE DUE TO TREATMENT OF COVID-19  AND CONSERVATION OF PERSONAL PROTECTIVE EQUIPMENT, LIMITED EXAM FINDINGS INCLUDE-   PHYSICAL EXAMINATION:  GENERAL:critically ill appearing, +resp distress NEUROLOGIC: obtunded, GCS<8   Patient assessed or the symptoms described in the history of present illness.  In the context of the Global COVID-19 pandemic, which necessitated consideration that the patient might be at risk for infection with the SARS-CoV-2 virus that causes COVID-19, Institutional protocols and algorithms that pertain to the evaluation of patients at risk for COVID-19 are in a state of rapid change based on information released by regulatory bodies including the CDC and federal and state organizations. These policies and algorithms were followed during the patient's care while in hospital.    MEDICATIONS: I have reviewed all medications and confirmed regimen as documented   CULTURE RESULTS   Recent Results (from the past 240 hour(s))  CULTURE, BLOOD (ROUTINE X 2) w Reflex to ID Panel     Status: None (Preliminary result)   Collection Time: 04/14/20  5:59 PM   Specimen: BLOOD  Result Value Ref Range Status   Specimen Description BLOOD LEFT ANTECUBITAL  Final   Special Requests   Final    BOTTLES DRAWN AEROBIC AND ANAEROBIC Blood Culture results may not be optimal due to an excessive volume of blood received in culture bottles   Culture   Final    NO GROWTH < 12 HOURS Performed at [facility], [address] [place], [place] [address]    Report Status PENDING  Incomplete  CULTURE, BLOOD (ROUTINE X 2) w Reflex to ID Panel  Status: None (Preliminary result)   Collection Time: 04/14/20  6:00 PM   Specimen: BLOOD  Result Value Ref Range Status   Specimen Description BLOOD RIGHT AC  Final   Special Requests   Final    BOTTLES DRAWN AEROBIC ONLY Blood Culture adequate volume   Culture   Final    NO GROWTH < 12  HOURS Performed at [facility], [address] [place], [place] [address]    Report Status PENDING  Incomplete        BMP Latest Ref Rng & Units 2020/04/26 04/14/2020 04/13/2020  Glucose 70 - 99 mg/dL 270(J) 500(X) 381(W)  BUN 8 - 23 mg/dL 29(H) 37(J) 69(C)  Creatinine 0.61 - 1.24 mg/dL 7.89(F) 8.10(F) 7.51  Sodium 135 - 145 mmol/L 141 137 137  Potassium 3.5 - 5.1 mmol/L 5.1 4.2 3.7  Chloride 98 - 111 mmol/L 100 98 97(L)  CO2 22 - 32 mmol/L 24 28 27   Calcium 8.9 - 10.3 mg/dL 7.6(L) 7.8(L) 7.8(L)      IMAGING    No results found.   Nutrition Status: Nutrition Problem: Increased nutrient needs Etiology: catabolic illness (COVID-19) Signs/Symptoms: estimated needs Interventions: Ensure Enlive (each supplement provides 350kcal and 20 grams of protein), Magic cup, Liberalize Diet     Indwelling Urinary Catheter continued, requirement due to   Reason to continue Indwelling Urinary Catheter strict Intake/Output monitoring for hemodynamic instability   Central Line/ continued, requirement due to  Reason to continue Monitoring of central venous pressure or other hemodynamic parameters and poor IV access   Ventilator continued, requirement due to severe respiratory failure   Ventilator Sedation RASS 0 to -2      ASSESSMENT AND PLAN SYNOPSIS  Acute hypoxemic respiratory failure due to COVID-19 pneumonia / ARDS Mechanical ventilation via ARDS protocol, target PRVC 6 cc/kg Wean PEEP and FiO2 as able Goal plateau pressure less than 30, driving pressure less than 15 Paralytics if necessary for vent synchrony, gas exchange Cycle prone positioning if necessary for oxygenation Deep sedation per PAD protocol, goal RASS -4, currently fentanyl, midazolam Diuresis as blood pressure and renal function can tolerate, goal CVP 5-8.   diuresis as tolerated based on Kidney function VAP prevention order set Remdesivir  IV STEROIDS  Follow inflammatory markers:  Ferritin, D-dimer, CRP, IL-6, LDH Vitamin C, zinc Plan to repeat and check resp cultures    Severe ACUTE Hypoxic and Hypercapnic Respiratory Failure -continue Full MV support -continue Bronchodilator Therapy -Wean Fio2 and PEEP as tolerated -VAP/VENT bundle implementation  Morbid obesity, possible OSA.   Will certainly impact respiratory mechanics, ventilator weaning Suspect will need to consider additional PEEP   ACUTE KIDNEY INJURY/Renal Failure -continue Foley Catheter-assess need -Avoid nephrotoxic agents -Follow urine output, BMP -Ensure adequate renal perfusion, optimize oxygenation -Renal dose medications    NEUROLOGY Acute toxic metabolic encephalopathy, need for sedation Goal RASS -2 to -3   CARDIAC ICU monitoring  ID -continue IV abx as prescibed -follow up cultures  GI GI PROPHYLAXIS as indicated   DIET-->TF's as tolerated Constipation protocol as indicated  ENDO - will use ICU hypoglycemic\Hyperglycemia protocol if indicated     ELECTROLYTES -follow labs as needed -replace as needed -pharmacy consultation and following   DVT/GI PRX ordered and assessed TRANSFUSIONS AS NEEDED MONITOR FSBS I Assessed the need for Labs I Assessed the need for Foley I Assessed the need for Central Venous Line Family Discussion when available I Assessed the need for Mobilization I made an Assessment of medications  to be adjusted accordingly Safety Risk assessment completed   CASE DISCUSSED IN MULTIDISCIPLINARY ROUNDS WITH ICU TEAM  Critical Care Time devoted to patient care services described in this note is 35 minutes.   Overall, patient is critically ill, prognosis is guarded.  Patient with Multiorgan failure and at high risk for cardiac arrest and death.    Lucie Leather, M.D.  [facility]  Medical Director [facility] Medical Director [facility] Cardio-Pulmonary Department

## 2020-05-11 NOTE — Procedures (Signed)
Chest Tube Insertion: Indication:PTX RT lung Consent:verbal emergent  Risks and benefits explained in detail including risk of infection, bleeding, respiratory failure and death..   Hand washing performed prior to starting the procedure.   Type of Anesthesia: 1 % Lidocaine.   Procedure: An active timeout was performed and correct patient, name, & ID confirmed.  After explaining risks and benefits, patient positioned correctly for chest tube placement. Patient was prepped in a sterile fashion including chlorohexadine preps, sterile drape, sterile gown and sterile gloves. Introducer needle was placed and guide wire was inserted. Using Seldinger Technique, the introducer needle was removed and three dilators were used to create an opening for insertion of chest tube. Chest tube sutured in place and proper dressing used..   Findings: Gush if air heard upon insetion of needle, Chest tube placed bewteen 5-7 ribs midaxiilary line.   Chest tube connected to: pleurovac .   Number of Attempts:1  Complications:none  Estimated Blood Loss:none  Chest radiograph indicated and ordered.   Operator: Franne Forts, M.D.  [facility]  Medical Director [facility] Medical Director [facility] Cardio-Pulmonary Department

## 2020-05-11 NOTE — Death Summary Note (Addendum)
DEATH SUMMARY   Patient Details  Name: Johnny Shepard MRN: [id number] DOB: 03-Jul-1951  Admission/Discharge Information   Admit Date:  April 23, 2020  Date of Death: Date of Death: 2020/05/07  Time of Death: Time of Death: 12-12-2038  Length of Stay: 12-15-2022  Referring Physician: Patient, No Pcp Per   Reason(s) for Hospitalization  COVID-19 [place]  Diagnoses  Preliminary cause of death:  COVID 19 PNEUMONIA/ARDS Secondary Diagnoses (including complications and co-morbidities):  Principal Problem:   Acute hypoxemic respiratory failure due to COVID-19 [facility]) Active Problems:   Pneumonia due to COVID-19 virus   Atrial fibrillation, chronic (HCC)   Thrombocytopenia (HCC)   Elevated transaminase level   Pneumomediastinum (HCC)   Weakness   Pneumothorax   Acquired thrombophilia (HCC)   Acute respiratory failure [facility])   Brief Hospital Course (including significant findings, care, treatment, and services provided and events leading to death)  Johnny Shepard is a 69 y.o. year old male with recent outpatient diagnosis with Covid.  Admitted for acute hypoxic respiratory failure secondary to Covid multifocal pneumonia, admitted to stepdown, PCCM consulting.  04/12/20- patient continues to require elevated setting on HFNC.  Patient on Eliquis some concern for PE due to no improvement for 10 days despite awake alert lucid status.Lower extremity negative for DVT     Events: 04/07/20- no overnight events patient verbalizes in full sentences, relates clinical improvement.  04/08/20- patient in good spirits , he is still on elevated settings with HFNC.Encourage use of IS. 8/30- Requiring HFNC + NRB,no conversational dyspnea. 8/31-pneumomediastinum noted on chest x-ray, acetylcysteine infusion 5-day protocol started. 9/1- patient continues to require elevated settings on high flow and desaturates with urination and any minimal activity.  9/3: Emergently intubated and lined for acute hypoxia, left pneumothorax  s/p left pigtail placement 05-08-2023 severe ARDS PRONED POSITION 04/16/20: Expired  Pertinent Labs and Studies  Significant Diagnostic Studies US Venous Img Lower Bilateral (DVT)  Result Date: 04/05/2020 CLINICAL DATA:  Bilateral lower extremity pain and edema. Elevated D-dimer. Patient is currently on anticoagulation. Evaluate for DVT. EXAM: BILATERAL LOWER EXTREMITY VENOUS DOPPLER ULTRASOUND TECHNIQUE: Gray-scale sonography with graded compression, as well as color Doppler and duplex ultrasound were performed to evaluate the lower extremity deep venous systems from the level of the common femoral vein and including the common femoral, femoral, profunda femoral, popliteal and calf veins including the posterior tibial, peroneal and gastrocnemius veins when visible. The superficial great saphenous vein was also interrogated. Spectral Doppler was utilized to evaluate flow at rest and with distal augmentation maneuvers in the common femoral, femoral and popliteal veins. COMPARISON:  None. FINDINGS: RIGHT LOWER EXTREMITY Common Femoral Vein: No evidence of thrombus. Normal compressibility, respiratory phasicity and response to augmentation. Saphenofemoral Junction: No evidence of thrombus. Normal compressibility and flow on color Doppler imaging. Profunda Femoral Vein: No evidence of thrombus. Normal compressibility and flow on color Doppler imaging. Femoral Vein: No evidence of thrombus. Normal compressibility, respiratory phasicity and response to augmentation. Popliteal Vein: No evidence of thrombus. Normal compressibility, respiratory phasicity and response to augmentation. Calf Veins: No evidence of thrombus. Normal compressibility and flow on color Doppler imaging. Superficial Great Saphenous Vein: No evidence of thrombus. Normal compressibility. Venous Reflux:  None. Other Findings:  None. LEFT LOWER EXTREMITY Common Femoral Vein: No evidence of thrombus. Normal compressibility, respiratory phasicity and  response to augmentation. Saphenofemoral Junction: No evidence of thrombus. Normal compressibility and flow on color Doppler imaging. Profunda Femoral Vein: No evidence of thrombus. Normal compressibility and flow on color  Doppler imaging. Femoral Vein: No evidence of thrombus. Normal compressibility, respiratory phasicity and response to augmentation. Popliteal Vein: No evidence of thrombus. Normal compressibility, respiratory phasicity and response to augmentation. Calf Veins: No evidence of thrombus. Normal compressibility and flow on color Doppler imaging. Superficial Great Saphenous Vein: No evidence of thrombus. Normal compressibility. Venous Reflux:  None. Other Findings:  None. IMPRESSION: No evidence of DVT within either lower extremity. Electronically Signed   By: Simonne Come M.D.   On: 04/05/2020 07:34   DG Chest Port 1 View  Result Date: 05/08/2020 CLINICAL DATA:  Status post right chest tube placement. EXAM: PORTABLE CHEST 1 VIEW COMPARISON:  Earlier today. FINDINGS: Endotracheal tube in satisfactory position. Nasogastric tube extending into the stomach. Esophageal monitor tip at the level of the clavicles. Interval right chest tube with a significant decrease in size of the previously demonstrated right pneumothorax. There is an approximately 5% right apical pneumothorax remaining. Interval subcutaneous emphysema laterally on the right. Stable left chest pleural catheter with decreased size of a previously demonstrated small left apical pneumothorax, currently estimated at approximately 2%. Pneumomediastinum is again demonstrated on the left. Patchy and interstitial opacity throughout the majority of both lungs with mild improvement on the left. No pleural fluid seen. Lower thoracic spine degenerative changes. IMPRESSION: 1. Interval right chest tube with a significant decrease in size of the previously demonstrated right pneumothorax, currently 5%. 2. Stable left chest pleural catheter with  decreased size of a previously demonstrated small left apical pneumothorax. 3. Bilateral pneumonia or alveolar edema with mild improvement on the left. Electronically Signed   By: Beckie Salts M.D.   On: 05/08/2020 11:02   DG Chest Port 1 View  Addendum Date: 04/26/2020   ADDENDUM REPORT: 04/21/2020 09:01 ADDENDUM: Critical Value/emergent results were relayed on 05/03/2020 at 9:00 am to provider Erin Fulling , who verbally acknowledged these results. Electronically Signed   By: Amie Portland M.D.   On: 04/19/2020 09:01   Result Date: 04/22/2020 CLINICAL DATA:  Post prone ET tube placement check. Patient admitted with acute respiratory failure due to COVID- 19. EXAM: PORTABLE CHEST 1 VIEW COMPARISON:  04/14/2020 FINDINGS: New right-sided pneumothorax, estimated size approximately 30%. Small residual left apical pneumothorax, similar to the previous day's exam. Stable left chest tube. Stable pneumomediastinum. Diffuse bilateral airspace lung opacities appear increased from the previous day's exam, on the right and part due to partial lung collapse. Endotracheal tube and nasal/orogastric tube are stable and well positioned. IMPRESSION: 1. New right pneumothorax estimated at 30%. Critical Value/emergent results were called by telephone at the time of interpretation on 04/27/2020 at 8:53 am to the ICU. The provider could not be contacted at the time of this dictation. The new right pneumothorax finding was related to the ICU staff. 2. Bilateral increase in airspace lung opacities which may reflect worsening bilateral pneumonia and/or ARDS. 3. No change in the small left apical pneumothorax or pneumomediastinum. Stable support apparatus. Electronically Signed: By: Amie Portland M.D. On: 05/09/2020 08:55   DG Chest Port 1 View  Result Date: 04/14/2020 CLINICAL DATA:  Pneumothorax EXAM: PORTABLE CHEST 1 VIEW COMPARISON:  Chest x-ray dated 04/13/2020 FINDINGS: LEFT-sided chest tube in place with tip at the lateral aspects  of the LEFT hemithorax. Small residual pneumothorax. Endotracheal tube appears well positioned with tip above the level the carina. Enteric tube passes below the diaphragm. Mild bilateral interstitial prominence, presumed edema. Persistent mild central pulmonary vascular congestion. No pleural effusion is seen. IMPRESSION: 1. Small residual  LEFT-sided pneumothorax.  Chest tube in place. 2. Persistent mild central pulmonary vascular congestion and bilateral interstitial prominence, presumed edema. 3. Endotracheal tube appears well positioned with tip above the level of the carina. Electronically Signed   By: Bary RichardStan  Maynard M.D.   On: 04/14/2020 07:06   DG Chest Port 1 View  Result Date: 04/13/2020 CLINICAL DATA:  Endotracheal and OG tube placement. EXAM: PORTABLE CHEST 1 VIEW COMPARISON:  Radiograph earlier today. FINDINGS: The endotracheal tube tip is 2.4 cm from the carina. Enteric tube in place with tip and side-port below the diaphragm in the stomach. Placement of left pigtail catheter with decreased left pneumothorax. Small residual at the apex under the second rib. Again seen pneumomediastinum with subcutaneous emphysema in the right chest wall and bilateral supraclavicular soft tissues. No visualized right pneumothorax. Patchy heterogeneous bilateral lung opacities without significant interval change. No large pleural effusion. IMPRESSION: 1. Endotracheal tube tip 2.4 cm from the carina. Enteric tube in place with tip and side-port below the diaphragm in the stomach. 2. Placement of left pigtail catheter with decreased left pneumothorax. Small residual at the left apex under the second rib. 3. Unchanged pneumomediastinum and subcutaneous emphysema. Unchanged bilateral lung opacities typical of COVID-19 pneumonia. Electronically Signed   By: Narda RutherfordMelanie  Sanford M.D.   On: 04/13/2020 21:34   DG Chest Port 1 View  Result Date: 04/13/2020 CLINICAL DATA:  Pneumothorax. EXAM: PORTABLE CHEST 1 VIEW COMPARISON:   Same day. FINDINGS: Stable cardiomediastinal silhouette. Pneumomediastinum is again noted with right supraclavicular soft tissue emphysema. Small left apical pneumothorax is noted which is slightly enlarged compared to prior exam. Stable bilateral lung opacities are noted concerning for atelectasis or infection. Bony thorax is unremarkable. IMPRESSION: Small left apical pneumothorax is noted which is slightly enlarged compared to prior exam. Stable pneumomediastinum and right supraclavicular soft tissue emphysema. Stable bilateral lung opacities are noted concerning for atelectasis or infection. Electronically Signed   By: Lupita RaiderJames  Green Jr M.D.   On: 04/13/2020 12:26   DG Chest Port 1 View  Result Date: 04/13/2020 CLINICAL DATA:  Hypoxia. EXAM: PORTABLE CHEST 1 VIEW COMPARISON:  04/10/2020 chest radiograph FINDINGS: A new small to moderate LEFT pneumothorax is noted, approximately 20%. Pneumomediastinum extending into the neck is also identified. Diffuse bilateral airspace opacities are again noted. Cardiomediastinal silhouette is stable. No pleural effusion or acute bony abnormality identified. IMPRESSION: 1. New small to moderate LEFT pneumothorax, approximately 20%. Pneumomediastinum extending into the neck. 2. Diffuse bilateral airspace opacities again noted. Critical Value/emergent results were called by telephone at the time of interpretation on 04/13/2020 at 8:31 am to provider Sarah, charge nurse, who verbally acknowledged these results. Electronically Signed   By: Harmon PierJeffrey  Hu M.D.   On: 04/13/2020 08:31   DG Chest Port 1 View  Result Date: 04/10/2020 CLINICAL DATA:  Shortness of breath. EXAM: PORTABLE CHEST 1 VIEW COMPARISON:  April 01, 2020. FINDINGS: Stable cardiomediastinal silhouette. No pneumothorax or pleural effusion is noted. Stable bibasilar lung opacities are noted concerning for pneumonia or possibly infiltrates. However, there may be pneumomediastinum present at this time. CT scan is  recommended for further evaluation. Bony thorax is unremarkable. IMPRESSION: Stable bibasilar lung opacities are noted concerning for pneumonia or possibly infiltrates. However, there may be pneumomediastinum present at this time. CT scan of the chest is recommended for further evaluation. These results will be called to the ordering clinician or representative by the Radiologist Assistant, and communication documented in the PACS or zVision Dashboard. Electronically Signed  By: Lupita Raider M.D.   On: 04/10/2020 08:29   DG Chest Portable 1 View  Result Date: April 06, 2020 CLINICAL DATA:  Dyspnea EXAM: PORTABLE CHEST 1 VIEW COMPARISON:  None. FINDINGS: The lungs are well expanded and are symmetric. Minimal patchy pulmonary infiltrate may be present at the left lung base and within the right mid lung zone, possibly infectious or inflammatory in the acute setting. There is no pneumothorax or pleural effusion. Cardiac size within normal limits. No acute bone abnormality. IMPRESSION: Minimal patchy pulmonary infiltrate may be present at the left lung base and within the right mid lung zone, possibly infectious or inflammatory in the acute setting. Electronically Signed   By: Helyn Numbers MD   On: 04-06-20 19:39    Microbiology Recent Results (from the past 240 hour(s))  CULTURE, BLOOD (ROUTINE X 2) w Reflex to ID Panel     Status: None (Preliminary result)   Collection Time: 04/14/20  5:59 PM   Specimen: BLOOD  Result Value Ref Range Status   Specimen Description BLOOD LEFT ANTECUBITAL  Final   Special Requests   Final    BOTTLES DRAWN AEROBIC AND ANAEROBIC Blood Culture results may not be optimal due to an excessive volume of blood received in culture bottles   Culture   Final    NO GROWTH < 12 HOURS Performed at [facility], [address] [place], [place] [address]    Report Status PENDING  Incomplete  CULTURE, BLOOD (ROUTINE X 2) w Reflex to ID Panel     Status: None  (Preliminary result)   Collection Time: 04/14/20  6:00 PM   Specimen: BLOOD  Result Value Ref Range Status   Specimen Description BLOOD RIGHT [facility]  Final   Special Requests   Final    BOTTLES DRAWN AEROBIC ONLY Blood Culture adequate volume   Culture   Final    NO GROWTH < 12 HOURS Performed at [facility], [address] [place], [place] [address]    Report Status PENDING  Incomplete    Lab Basic Metabolic Panel: Recent Labs  Lab 04/11/20 0415 04/11/20 0415 04/12/20 0432 04/12/20 0432 04/13/20 0932 04/13/20 2306 04/14/20 0942 04/19/2020 0515 04/11/2020 1840  NA 133*   < > 133*   < > 136 137 137 141 143  K 4.7   < > 4.3   < > 3.7 3.7 4.2 5.1 7.1*  CL 94*   < > 94*   < > 94* 97* 98 100 101  CO2 30   < > 30   < > 31 27 28 24  18*  GLUCOSE 205*   < > 193*   < > 158* 155* 182* 196* 86  BUN 41*   < > 46*   < > 40* 48* 58* 74* 83*  CREATININE 0.75   < > 0.90   < > 0.79 0.84 1.48* 3.31* 4.40*  CALCIUM 8.4*   < > 8.2*   < > 8.2* 7.8* 7.8* 7.6* 7.1*  MG 2.5*  --   --   --   --  2.2  --   --  3.2*  PHOS 3.0   < > 3.2  --  3.0 5.0* 5.8* 10.4*  --    < > = values in this interval not displayed.   Liver Function Tests: Recent Labs  Lab 04/12/20 0432 04/13/20 0932 04/13/20 2306 04/14/20 0942 05/04/2020 0515  AST 24 31 38 37 644*  ALT 41 45* 52* 51*  923*  ALKPHOS 52 63 57 67 81  BILITOT 1.1 1.1 0.9 1.0 1.6*  PROT 5.2* 5.3* 5.2* 5.5* 5.5*  ALBUMIN 2.0* 2.1* 2.0* 2.1* 2.3*   No results for input(s): LIPASE, AMYLASE in the last 168 hours. No results for input(s): AMMONIA in the last 168 hours. CBC: Recent Labs  Lab 04/09/20 0409 04/09/20 0409 04/10/20 0523 04/10/20 0523 04/11/20 0415 04/11/20 0415 04/12/20 0432 04/13/20 2306 04/14/20 0942 04/14/20 1715 04/19/2020 0515  WBC 13.3*   < > 15.3*   < > 17.8*   < > 14.2* 16.5* 24.4* 23.1* 21.2*  NEUTROABS 12.5*  --  14.2*  --  16.5*  --  13.0*  --   --   --   --   HGB 12.5*   < > 12.3*   < > 13.3   < > 12.3* 12.8* 13.0  12.2* 11.7*  HCT 36.5*   < > 35.7*   < > 36.9*   < > 35.8* 38.5* 39.4 38.9* 36.8*  MCV 87.7   < > 89.0   < > 84.4   < > 87.1 89.5 90.6 95.3 95.6  PLT 233   < > 208   < > 204   < > 162 156 168 145* 124*   < > = values in this interval not displayed.   Cardiac Enzymes: No results for input(s): CKTOTAL, CKMB, CKMBINDEX, TROPONINI in the last 168 hours. Sepsis Labs: Recent Labs  Lab 04/12/20 0432 04/13/20 0932 04/13/20 2306 04/14/20 0942 04/14/20 1715 05/01/2020 0515  PROCALCITON  --  <0.10 <0.10 <0.10  --  5.53  WBC   < >  --  16.5* 24.4* 23.1* 21.2*   < > = values in this interval not displayed.    Procedures/Operations  Chest tube placement   Magddalene S Tukov-Yual 04/26/2020, 10:34 PM

## 2020-05-11 NOTE — Progress Notes (Signed)
Chaplain responded to a page request from RN. Chaplain offered emotional-spiritual support to Spouse.  Spouse led Chaplain in a conversation about Pt's health condition. Spouse indicated Pt has had more "challenges" since Chaplain's last visit.  Chaplain actively listened and invited Spouse to speak of her lifelong relationship to Pt. Spouse shared stories about her marriage to Pt and the joy they have experienced with their children/grandchildren. 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  Chaplain suggests that team members look for opportunities for spouse to share her thoughts and feelings about her life experiences with Pt.  Clenton Pare Staff Chaplain-Relief

## 2020-05-11 NOTE — Progress Notes (Signed)
Chaplain responded to page request to Patient's room. Patient was intubated and did not respond to the Chaplain's encouraging words.   Spouse told Chaplain about Pt's illness and  stay in the medical center. Spouse indicated that [place] prayer is important to her. Chaplain actively listened to Spouse.  Spouse conveyed she is concerned her husband is almost to the point of needing some type of divine intervention. Spouse also spoke of family needs.  Spouse led Chaplain in a conversation about being married to patient for over fifty years. C   Spouse asked Chaplain to pray with her and Pt.  Chaplain acknowledged feelings of Spouse and prayed a [place] prayer. Chaplain explained how Spouse could request a Chaplain in the future.  Clenton Pare, MDiv Staff Chaplain-Relief

## 2020-05-11 NOTE — Progress Notes (Signed)
Patient pronounced at 2040 by 2 RNs.  Patient's wife and son at bedside with patient at time of passing.  Belongings returned to family.  CDS called; referral number [phone].

## 2020-05-11 DEATH — deceased

## 2022-03-03 IMAGING — DX DG CHEST 1V PORT
1 series · 1 of 1 positions shown · non-contrast
Comparison: 04/10/2020 chest radiograph

CLINICAL DATA: Hypoxia.

EXAM:
PORTABLE CHEST 1 VIEW

[chest ap]
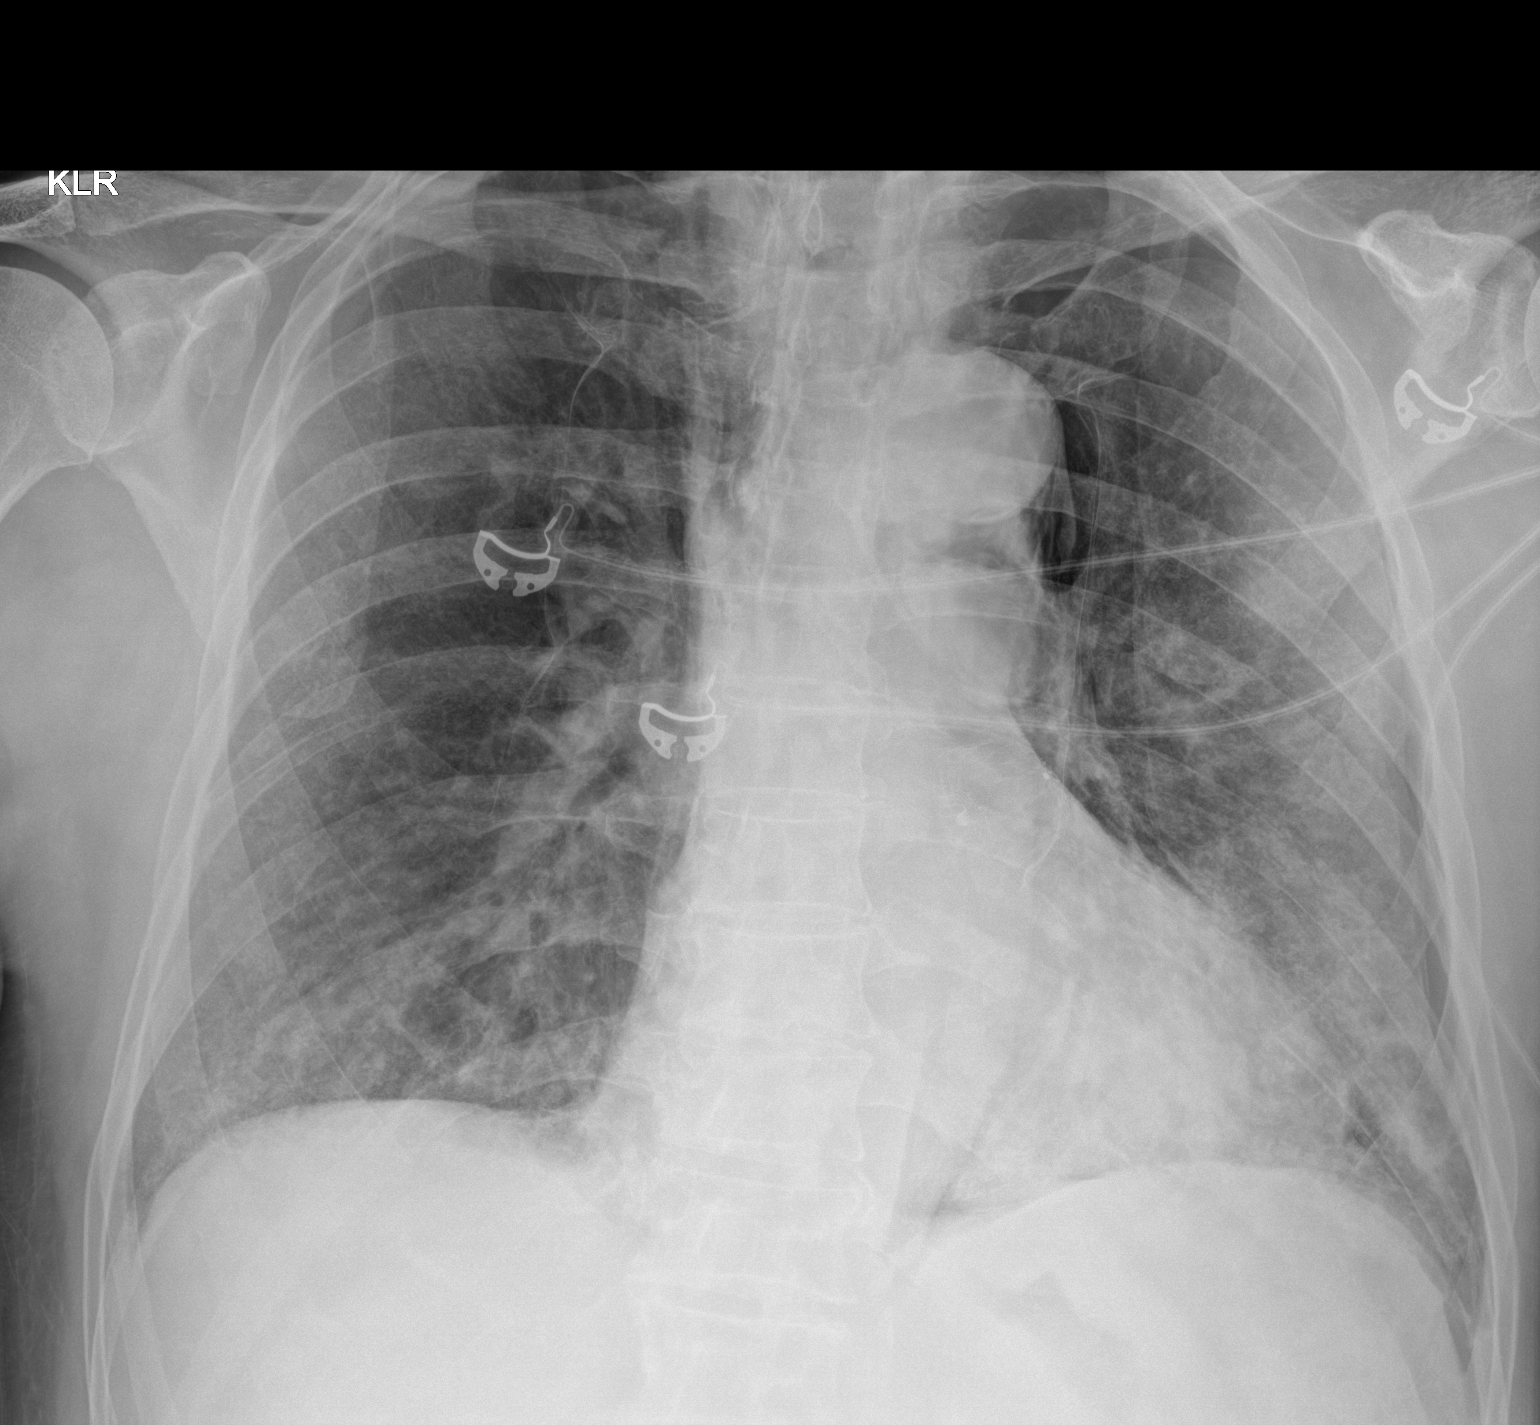

[1 of 1 positions shown; findings below may reference images not displayed]

FINDINGS: A new small to moderate LEFT pneumothorax is noted, approximately
20%.

Pneumomediastinum extending into the neck is also identified.

Diffuse bilateral airspace opacities are again noted.

Cardiomediastinal silhouette is stable.

No pleural effusion or acute bony abnormality identified.
IMPRESSION: 1. New small to moderate LEFT pneumothorax, approximately 20%.
Pneumomediastinum extending into the neck.
2. Diffuse bilateral airspace opacities again noted.

Critical Value/emergent results were called by telephone at the time
of interpretation on 04/13/2020 at [DATE] to provider Pakosni Mrav, Lesendra M
nurse, who verbally acknowledged these results.
